# Patient Record
Sex: Female | Born: 1995 | Race: White | Hispanic: No | Marital: Single | Smoking: Never smoker
Health system: Southern US, Community
[De-identification: ages and names within clinical notes are randomized; demographics above are authoritative.]

## PROBLEM LIST (undated history)

## (undated) DIAGNOSIS — F419 Anxiety disorder, unspecified: Secondary | ICD-10-CM

## (undated) DIAGNOSIS — F329 Major depressive disorder, single episode, unspecified: Secondary | ICD-10-CM

## (undated) DIAGNOSIS — T7840XA Allergy, unspecified, initial encounter: Secondary | ICD-10-CM

## (undated) DIAGNOSIS — F32A Depression, unspecified: Secondary | ICD-10-CM

## (undated) HISTORY — DX: Anxiety disorder, unspecified: F41.9

## (undated) HISTORY — PX: WISDOM TOOTH EXTRACTION: SHX21

## (undated) HISTORY — DX: Allergy, unspecified, initial encounter: T78.40XA

## (undated) HISTORY — DX: Major depressive disorder, single episode, unspecified: F32.9

## (undated) HISTORY — DX: Depression, unspecified: F32.A

---

## 2016-12-05 ENCOUNTER — Ambulatory Visit (INDEPENDENT_AMBULATORY_CARE_PROVIDER_SITE_OTHER): Payer: 59

## 2016-12-05 ENCOUNTER — Ambulatory Visit (INDEPENDENT_AMBULATORY_CARE_PROVIDER_SITE_OTHER): Payer: 59 | Admitting: Physician Assistant

## 2016-12-05 ENCOUNTER — Encounter: Payer: Self-pay | Admitting: Physician Assistant

## 2016-12-05 VITALS — BP 117/81 | HR 96 | Temp 98.8°F | Resp 17 | Ht 61.75 in | Wt 178.4 lb

## 2016-12-05 DIAGNOSIS — S76311A Strain of muscle, fascia and tendon of the posterior muscle group at thigh level, right thigh, initial encounter: Secondary | ICD-10-CM | POA: Diagnosis not present

## 2016-12-05 DIAGNOSIS — M25572 Pain in left ankle and joints of left foot: Secondary | ICD-10-CM

## 2016-12-05 MED ORDER — CYCLOBENZAPRINE HCL 10 MG PO TABS: 5.0000 mg | ORAL_TABLET | Freq: Three times a day (TID) | ORAL | 0 refills | Status: DC | PRN

## 2016-12-05 MED ORDER — ACETAMINOPHEN 500 MG PO TABS: 1000.0000 mg | ORAL_TABLET | Freq: Three times a day (TID) | ORAL | 99 refills | Status: AC | PRN

## 2016-12-05 MED ORDER — NAPROXEN 500 MG PO TABS: 500.0000 mg | ORAL_TABLET | Freq: Two times a day (BID) | ORAL | 0 refills | Status: DC

## 2016-12-05 NOTE — Patient Instructions (Addendum)
Rest, Ice, Compression, and Elevation.  Take prescription medication as needed.     IF you received an x-ray today, you will receive an invoice from [facility] Radiology. Please contact [facility] Radiology at [phone] with questions or concerns regarding your invoice.   IF you received labwork today, you will receive an invoice from [place]. Please contact [facility] at [phone] with questions or concerns regarding your invoice.   Our billing staff will not be able to assist you with questions regarding bills from these companies.  You will be contacted with the lab results as soon as they are available. The fastest way to get your results is to activate your My Chart account. Instructions are located on the last page of this paperwork. If you have not heard from [place] regarding the results in 2 weeks, please contact this office.

## 2016-12-05 NOTE — Progress Notes (Signed)
12/05/2016 12:02 PM   DOB: [birth year] / MRN: [id number]  SUBJECTIVE:  Meghan Norman is a 21 y.o. female presenting for a left ankle pain that started after an inversion injury when she was "pulled down some stairs by a dog." She was dog sitting.  Associates lateral ankle swelling.    She complains of posterior right pain distal thigh pain that is worse with movement.  Bending the knee makes the pain worse.  Relaxing the leg make the pain better.   She is allergic to singulair [montelukast sodium].   She  has a past medical history of Allergy; Anxiety; and Depression.    She  reports that she has never smoked. She has never used smokeless tobacco. She reports that she drinks alcohol. She reports that she does not use drugs. She  has no sexual activity history on file. The patient  has no past surgical history on file.  Her family history includes Diabetes in her maternal grandfather; Hyperlipidemia in her father and mother.  Review of Systems  Constitutional: Negative for chills and fever.  Respiratory: Negative for shortness of breath.   Cardiovascular: Negative for chest pain.  Musculoskeletal: Positive for falls, joint pain and myalgias. Negative for back pain and neck pain.  Skin: Negative for itching and rash.  Neurological: Negative for dizziness and headaches.    The problem list and medications were reviewed and updated by myself where necessary and exist elsewhere in the encounter.   OBJECTIVE:  BP 117/81   Pulse 96   Temp 98.8 F (37.1 C) (Oral)   Resp 17   Ht 5' 1.75" (1.568 m)   Wt 178 lb 6 oz (80.9 kg)   LMP 11/28/2016 (Approximate)   SpO2 100%   BMI 32.89 kg/m   Physical Exam  Cardiovascular: Normal rate and regular rhythm.   Pulmonary/Chest: Effort normal and breath sounds normal.  Musculoskeletal:       Left ankle: She exhibits decreased range of motion, swelling and ecchymosis. Tenderness. Lateral malleolus tenderness found. No medial malleolus, no AITFL,  no CF ligament, no posterior TFL, no head of 5th metatarsal and no proximal fibula tenderness found.       Legs:   No results found for this or any previous visit (from the past 72 hour(s)).  Dg Tibia/fibula Left  Result Date: 12/05/2016 CLINICAL DATA:  Fall walking dog, lateral ankle pain EXAM: LEFT TIBIA AND FIBULA - 2 VIEW COMPARISON:  None. FINDINGS: No fracture or dislocation is seen. The joint spaces are preserved. Visualized soft tissues are within normal limits. IMPRESSION: Negative. Electronically Signed   By: Julian Hy M.D.   On: 12/05/2016 11:23   Dg Ankle Complete Left  Result Date: 12/05/2016 CLINICAL DATA:  Fall walking dog, lateral ankle pain EXAM: LEFT ANKLE COMPLETE - 3+ VIEW COMPARISON:  None. FINDINGS: No fracture or dislocation is seen. The ankle mortise is intact. The base of the fifth metatarsal is unremarkable. Mild lateral soft tissue swelling. IMPRESSION: No fracture or dislocation is seen. Mild lateral soft tissue swelling. Electronically Signed   By: Julian Hy M.D.   On: 12/05/2016 11:24    ASSESSMENT AND PLAN:  Meghan Norman was seen today for fall.  Diagnoses and all orders for this visit:  Acute left ankle pain: No fracture. [place]. Pain control. RTC as needed. -     Cancel: DG Ankle Complete Left; Future -     DG Tibia/Fibula Left; Future -     naproxen (NAPROSYN) 500 MG tablet; Take  1 tablet (500 mg total) by mouth 2 (two) times daily with a meal. -     acetaminophen (TYLENOL) 500 MG tablet; Take 2 tablets (1,000 mg total) by mouth every 8 (eight) hours as needed for mild pain, moderate pain or fever. Take 2 tabs every 8 hours. -     DG Ankle Complete Left; Future  Strain of right hamstring -     cyclobenzaprine (FLEXERIL) 10 MG tablet; Take 0.5-1 tablets (5-10 mg total) by mouth 3 (three) times daily as needed.    The patient is advised to call or return to clinic if she does not see an improvement in symptoms, or to seek the care of the  closest emergency department if she worsens with the above plan.   Philis Fendt, MHS, PA-C [facility] 12/05/2016 12:02 PM

## 2017-05-19 ENCOUNTER — Ambulatory Visit (INDEPENDENT_AMBULATORY_CARE_PROVIDER_SITE_OTHER): Payer: 59 | Admitting: Physician Assistant

## 2017-05-19 ENCOUNTER — Encounter: Payer: Self-pay | Admitting: Physician Assistant

## 2017-05-19 VITALS — BP 110/84 | HR 109 | Temp 98.0°F | Resp 18 | Ht 61.0 in | Wt 183.2 lb

## 2017-05-19 DIAGNOSIS — Z130 Encounter for screening for diseases of the blood and blood-forming organs and certain disorders involving the immune mechanism: Secondary | ICD-10-CM | POA: Diagnosis not present

## 2017-05-19 DIAGNOSIS — Z23 Encounter for immunization: Secondary | ICD-10-CM | POA: Diagnosis not present

## 2017-05-19 DIAGNOSIS — Z124 Encounter for screening for malignant neoplasm of cervix: Secondary | ICD-10-CM

## 2017-05-19 DIAGNOSIS — Z13228 Encounter for screening for other metabolic disorders: Secondary | ICD-10-CM

## 2017-05-19 DIAGNOSIS — Z113 Encounter for screening for infections with a predominantly sexual mode of transmission: Secondary | ICD-10-CM

## 2017-05-19 DIAGNOSIS — Z0000 Encounter for general adult medical examination without abnormal findings: Secondary | ICD-10-CM

## 2017-05-19 DIAGNOSIS — Z1329 Encounter for screening for other suspected endocrine disorder: Secondary | ICD-10-CM | POA: Diagnosis not present

## 2017-05-19 DIAGNOSIS — Z308 Encounter for other contraceptive management: Secondary | ICD-10-CM

## 2017-05-19 LAB — POCT URINE PREGNANCY: Preg Test, Ur: NEGATIVE

## 2017-05-19 MED ORDER — MEDROXYPROGESTERONE ACETATE 150 MG/ML IM SUSY
150.0000 mg | PREFILLED_SYRINGE | Freq: Once | INTRAMUSCULAR | Status: AC
Start: 2017-05-19 — End: 2017-05-19
  Administered 2017-05-19: 150 mg via INTRAMUSCULAR

## 2017-05-19 NOTE — Patient Instructions (Addendum)
Keeping You Healthy  Get These Tests 1. Blood Pressure- Have your blood pressure checked once a year by your health care provider.  Normal blood pressure is 120/80. 2. Weight- Have your body mass index (BMI) calculated to screen for obesity.  BMI is measure of body fat based on height and weight.  You can also calculate your own BMI at GravelBags.it. 3. Cholesterol- Have your cholesterol checked every 5 years starting at age 22 then yearly starting at age 74. 23. Chlamydia, HIV, and other sexually transmitted diseases- Get screened every year until age 28, then within three months of each new sexual provider. 5. Pap Test - Every 1-5 years; discuss with your health care provider. 6. Mammogram- Every 1-2 years starting at age 15--50  Take these medicines  Calcium with Vitamin D-Your body needs 1200 mg of Calcium each day and [id number] IU of Vitamin D daily.  Your body can only absorb 500 mg of Calcium at a time so Calcium must be taken in 2 or 3 divided doses throughout the day.  Multivitamin with folic acid- Once daily if it is possible for you to become pregnant.  Get these Immunizations  Gardasil-Series of three doses; prevents HPV related illness such as genital warts and cervical cancer.  Menactra-Single dose; prevents meningitis.  Tetanus shot- Every 10 years.  Flu shot-Every year.  Take these steps 1. Do not smoke-Your healthcare provider can help you quit.  For tips on how to quit go to www.smokefree.gov or call 1-800 QUITNOW. 2. Be physically active- Exercise 5 days a week for at least 30 minutes.  If you are not already physically active, start slow and gradually work up to 30 minutes of moderate physical activity.  Examples of moderate activity include walking briskly, dancing, swimming, bicycling, etc. 3. Breast Cancer- A self breast exam every month is important for early detection of breast cancer.  For more information and instruction on self breast exams, ask your  healthcare provider or https://www.patel.info/. 4. Eat a healthy diet- Eat a variety of healthy foods such as fruits, vegetables, whole grains, low fat milk, low fat cheeses, yogurt, lean meats, poultry and fish, beans, nuts, tofu, etc.  For more information go to www. Thenutritionsource.org 5. Drink alcohol in moderation- Limit alcohol intake to one drink or less per day. Never drink and drive. 6. Depression- Your emotional health is as important as your physical health.  If you're feeling down or losing interest in things you normally enjoy please talk to your healthcare provider about being screened for depression. 7. Dental visit- Brush and floss your teeth twice daily; visit your dentist twice a year. 8. Eye doctor- Get an eye exam at least every 2 years. 9. Helmet use- Always wear a helmet when riding a bicycle, motorcycle, rollerblading or skateboarding. 55. Safe sex- If you may be exposed to sexually transmitted infections, use a condom. 11. Seat belts- Seat belts can save your live; always wear one. 12. Smoke/Carbon Monoxide detectors- These detectors need to be installed on the appropriate level of your home. Replace batteries at least once a year. 13. Skin cancer- When out in the sun please cover up and use sunscreen 15 SPF or higher. 14. Violence- If anyone is threatening or hurting you, please tell your healthcare provider.           IF you received an x-ray today, you will receive an invoice from [facility] Radiology. Please contact [facility] Radiology at [phone] with questions or concerns regarding your invoice.  IF you received labwork today, you will receive an invoice from [place]. Please contact [facility] at [phone] with questions or concerns regarding your invoice.   Our billing staff will not be able to assist you with questions regarding bills from these companies.  You will be contacted with the lab results as soon as they are  available. The fastest way to get your results is to activate your My Chart account. Instructions are located on the last page of this paperwork. If you have not heard from [place] regarding the results in 2 weeks, please contact this office.

## 2017-05-19 NOTE — Progress Notes (Signed)
PRIMARY CARE AT [facility] [address] [place] [address] [phone]  Date:  05/19/2017   Name:  Meghan Norman   DOB:  02-21-1996   MRN:  [id number]  PCP:  Patient, No Pcp Per    History of Present Illness:  Meghan Norman is a 21 y.o. female patient who presents to PCP with  Chief Complaint  Patient presents with  . Annual Exam    With PAP, Pt would like to discuss BC     DIET: she eats yogurt, or hot bar from [employer].  She will get vegetables in almost daily.  Eats a lot of broccoli and green beans.  Water intake 16 oz.  Caffeine: sodas occasionally, cups 1-2 coffee.  BM: normal.  No blood or black stool;.  No constipation or diarrhea  URINATION: normal.  No dysuria, hematuria, or frequency.    SLEEP: 4-8 hours per night.  She feels anxiety which may keep her up at night.   SOCIAL ACTIVITY: she reads, watch TV, hang out with friends. She is currently not exercising.  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     No tobacco or vaping No illicit drug use 1-2 etoh beverages per week.    Menses: irregular, but has regulated.  She was on depo shot where it was regular.  After discontinuing the depo shot, she had a lot of irregularity Sexually active.  With condoms.  No dyspareunia. No history of std.  No history of abnormal pap smear.   There are no active problems to display for this patient.   Past Medical History:  Diagnosis Date  . Allergy   . Anxiety   . Depression     Past Surgical History:  Procedure Laterality Date  . WISDOM TOOTH EXTRACTION Bilateral     Social History   Tobacco Use  . Smoking status: Never Smoker  . Smokeless tobacco: Never Used  Substance Use Topics  . Alcohol use: Yes  . Drug use: No    Family History  Problem Relation Age of Onset  . Hyperlipidemia Mother   . Hyperlipidemia Father   . Diabetes Maternal Grandfather     Allergies  Allergen Reactions  . Hydrocodone Itching  . Singulair [Montelukast Sodium]  Other (See Comments)    Bad mood swings    Medication list has been reviewed and updated.  Current Outpatient Medications on File Prior to Visit  Medication Sig Dispense Refill  . acetaminophen (TYLENOL) 500 MG tablet Take 2 tablets (1,000 mg total) by mouth every 8 (eight) hours as needed for mild pain, moderate pain or fever. Take 2 tabs every 8 hours. 90 tablet prn  . Beclomethasone Diprop, Nasal, (QNASL NA) Place into the nose.    . butalbital-aspirin-caffeine (FIORINAL) 50-325-40 MG capsule Take 1 capsule 2 (two) times daily as needed by mouth for headache.    . cyclobenzaprine (FLEXERIL) 10 MG tablet Take 0.5-1 tablets (5-10 mg total) by mouth 3 (three) times daily as needed. (Patient not taking: Reported on 05/19/2017) 30 tablet 0  . naproxen (NAPROSYN) 500 MG tablet Take 1 tablet (500 mg total) by mouth 2 (two) times daily with a meal. (Patient not taking: Reported on 05/19/2017) 30 tablet 0   No current facility-administered medications on file prior to visit.     Review of Systems  Constitutional: Negative for chills and fever.  HENT: Negative for ear discharge, ear pain and sore throat.   Eyes: Negative for blurred vision and double vision.  Respiratory: Negative for cough, shortness of breath and wheezing.   Cardiovascular: Negative for chest pain, palpitations and leg swelling.  Gastrointestinal: Negative for diarrhea, nausea and vomiting.  Genitourinary: Negative for dysuria, frequency and hematuria.  Skin: Negative for itching and rash.  Neurological: Negative for dizziness and headaches.   ROS otherwise unremarkable unless listed above.  Physical Examination: BP 110/84 (BP Location: Left Arm, Patient Position: Sitting, Cuff Size: Normal)   Pulse (!) 109   Temp 98 F (36.7 C) (Oral)   Resp 18   Ht _0  (1.549 m)   Wt 183 lb 3.2 oz (83.1 kg)   LMP 05/11/2017   SpO2 98%   BMI 34.62 kg/m  Ideal Body Weight: Weight in (lb) to have BMI = 25: 132  Physical Exam   Constitutional: She is oriented to person, place, and time. She appears well-developed and well-nourished. No distress.  HENT:  Head: Normocephalic and atraumatic.  Right Ear: Tympanic membrane, external ear and ear canal normal.  Left Ear: Tympanic membrane, external ear and ear canal normal.  Nose: Right sinus exhibits no maxillary sinus tenderness and no frontal sinus tenderness. Left sinus exhibits no maxillary sinus tenderness and no frontal sinus tenderness.  Mouth/Throat: Oropharynx is clear and moist. No uvula swelling. No oropharyngeal exudate, posterior oropharyngeal edema or posterior oropharyngeal erythema.  Eyes: Conjunctivae and EOM are normal. Pupils are equal, round, and reactive to light.  Neck: Normal range of motion. Neck supple. No thyromegaly present.  Cardiovascular: Normal rate, regular rhythm, normal heart sounds and intact distal pulses. Exam reveals no gallop, no distant heart sounds and no friction rub.  No murmur heard. Pulmonary/Chest: Effort normal and breath sounds normal. No respiratory distress. She has no decreased breath sounds. She has no wheezes. She has no rhonchi.  Abdominal: Soft. Bowel sounds are normal. She exhibits no distension and no mass. There is no tenderness.  Musculoskeletal: Normal range of motion. She exhibits no edema or tenderness.  Lymphadenopathy:       Head (right side): No submandibular, no tonsillar, no preauricular and no posterior auricular adenopathy present.       Head (left side): No submandibular, no tonsillar, no preauricular and no posterior auricular adenopathy present.    She has no cervical adenopathy.  Neurological: She is alert and oriented to person, place, and time. No cranial nerve deficit. She exhibits normal muscle tone. Coordination normal.  Skin: Skin is warm and dry. She is not diaphoretic.  Psychiatric: She has a normal mood and affect. Her behavior is normal.    Results for orders placed or performed in visit  on 05/19/17  POCT urine pregnancy  Result Value Ref Range   Preg Test, Ur Negative Negative    Assessment and Plan: Meghan Norman is a 21 y.o. female who is here today for  Chief Complaint  Patient presents with  . Annual Exam    With PAP, Pt would like to discuss BC  rtc in 3 months for depo Annual physical exam - Plan: POCT urine pregnancy, CBC, CMP14+EGFR, TSH, RPR, HIV antibody, Pap IG and Chlamydia/Gonococcus, NAA, CANCELED: Lipid panel, CANCELED: Pap IG, CT/NG w/ reflex HPV when ASC-U  Flu vaccine need - Plan: Flu Vaccine QUAD 36+ mos IM  Encounter for other contraceptive management - Plan: POCT urine pregnancy  Screening for deficiency anemia  Screening for metabolic disorder  Screening for thyroid disorder  Screening for STD (sexually transmitted disease) - Plan: Pap IG and Chlamydia/Gonococcus, NAA  Screening for cervical  cancer - Plan: Pap IG and Chlamydia/Gonococcus, NAA  Ivar Drape, PA-C [facility] 11/7/20183:12 PM

## 2017-05-20 ENCOUNTER — Telehealth: Payer: Self-pay | Admitting: *Deleted

## 2017-05-20 LAB — CMP14+EGFR
ALT: 14 IU/L (ref 0–32)
AST: 17 IU/L (ref 0–40)
Albumin/Globulin Ratio: 1.7 (ref 1.2–2.2)
Albumin: 4.3 g/dL (ref 3.5–5.5)
Alkaline Phosphatase: 60 IU/L (ref 39–117)
BUN/Creatinine Ratio: 18 (ref 9–23)
BUN: 12 mg/dL (ref 6–20)
CALCIUM: 9.5 mg/dL (ref 8.7–10.2)
CHLORIDE: 105 mmol/L (ref 96–106)
CO2: 27 mmol/L (ref 20–29)
Creatinine, Ser: 0.67 mg/dL (ref 0.57–1.00)
GFR, EST AFRICAN AMERICAN: 144 mL/min/{1.73_m2} (ref >59)
GFR, EST NON AFRICAN AMERICAN: 125 mL/min/{1.73_m2} (ref >59)
GLUCOSE: 85 mg/dL (ref 65–99)
Globulin, Total: 2.5 g/dL (ref 1.5–4.5)
POTASSIUM: 3.9 mmol/L (ref 3.5–5.2)
Sodium: 142 mmol/L (ref 134–144)
TOTAL PROTEIN: 6.8 g/dL (ref 6.0–8.5)

## 2017-05-20 LAB — RPR: RPR: NONREACTIVE

## 2017-05-20 LAB — CBC
HEMATOCRIT: 41.1 % (ref 34.0–46.6)
HEMOGLOBIN: 13.9 g/dL (ref 11.1–15.9)
MCH: 28 pg (ref 26.6–33.0)
MCHC: 33.8 g/dL (ref 31.5–35.7)
MCV: 83 fL (ref 79–97)
Platelets: 281 10*3/uL (ref 150–379)
RBC: 4.96 x10E6/uL (ref 3.77–5.28)
RDW: 13.8 % (ref 12.3–15.4)
WBC: 4.9 10*3/uL (ref 3.4–10.8)

## 2017-05-20 LAB — TSH: TSH: 2.15 u[IU]/mL (ref 0.450–4.500)

## 2017-05-20 LAB — HIV ANTIBODY (ROUTINE TESTING W REFLEX): HIV SCREEN 4TH GENERATION: NONREACTIVE

## 2017-05-20 NOTE — Telephone Encounter (Signed)
Pt. returned call for lab results; results given; gave recommendations by Ivar Drape, PA.  Pt. verbalized understanding.    Pt. asking about when she goes back in  12 weeks for Depo Provera Injection; questioned if she needs to schedule an appt. or if it is done on a walk-in basis? Advised will notify the office of her question.

## 2017-05-20 NOTE — Telephone Encounter (Signed)
LMOM for pt to call the office regarding lab results.

## 2017-05-22 LAB — PAP IG AND CT-NG NAA
CHLAMYDIA, NUC. ACID AMP: NEGATIVE
GONOCOCCUS BY NUCLEIC ACID AMP: NEGATIVE
PAP SMEAR COMMENT: 0

## 2017-05-24 NOTE — Telephone Encounter (Signed)
Patient informed. 

## 2017-08-18 ENCOUNTER — Telehealth: Payer: Self-pay | Admitting: Physician Assistant

## 2017-08-18 NOTE — Telephone Encounter (Signed)
Called to remind pt of appt tomorrow. Left message. ° °Reminded pt of 15 minutes early, building number and 10 minute late policy. °

## 2017-08-19 ENCOUNTER — Ambulatory Visit: Payer: 59 | Admitting: Physician Assistant

## 2017-08-19 ENCOUNTER — Encounter: Payer: Self-pay | Admitting: Physician Assistant

## 2017-08-19 VITALS — BP 110/78 | HR 85 | Temp 98.6°F | Resp 16 | Ht 61.0 in | Wt 182.0 lb

## 2017-08-19 DIAGNOSIS — R0981 Nasal congestion: Secondary | ICD-10-CM | POA: Diagnosis not present

## 2017-08-19 DIAGNOSIS — R51 Headache: Secondary | ICD-10-CM | POA: Diagnosis not present

## 2017-08-19 DIAGNOSIS — R519 Headache, unspecified: Secondary | ICD-10-CM

## 2017-08-19 MED ORDER — DOXYCYCLINE HYCLATE 100 MG PO CAPS: 100.0000 mg | ORAL_CAPSULE | Freq: Two times a day (BID) | ORAL | 0 refills | Status: AC

## 2017-08-19 MED ORDER — BECLOMETHASONE DIPROPIONATE 80 MCG/ACT NA AERS: 1.0000 "application " | INHALATION_SPRAY | Freq: Every day | NASAL | 0 refills | Status: DC

## 2017-08-19 MED ORDER — KETOROLAC TROMETHAMINE 60 MG/2ML IM SOLN
60.0000 mg | Freq: Once | INTRAMUSCULAR | Status: AC
  Administered 2017-08-19: 60 mg via INTRAMUSCULAR

## 2017-08-19 MED ORDER — BUTALBITAL-APAP-CAFFEINE 50-325-40 MG PO TABS: 1.0000 | ORAL_TABLET | Freq: Three times a day (TID) | ORAL | 0 refills | Status: AC | PRN

## 2017-08-19 NOTE — Patient Instructions (Addendum)
Please continue to hydrate well with 64oz.   I will contact you in regards to your lab results.   Please take medication as prescribed.  General Headache Without Cause A headache is pain or discomfort felt around the head or neck area. There are many causes and types of headaches. In some cases, the cause may not be found. Follow these instructions at home: Managing pain  Take over-the-counter and prescription medicines only as told by your doctor.  Lie down in a dark, quiet room when you have a headache.  If directed, apply ice to the head and neck area: ? Put ice in a plastic bag. ? Place a towel between your skin and the bag. ? Leave the ice on for 20 minutes, 2-3 times per day.  Use a heating pad or hot shower to apply heat to the head and neck area as told by your doctor.  Keep lights dim if bright lights bother you or make your headaches worse. Eating and drinking  Eat meals on a regular schedule.  Lessen how much alcohol you drink.  Lessen how much caffeine you drink, or stop drinking caffeine. General instructions  Keep all follow-up visits as told by your doctor. This is important.  Keep a journal to find out if certain things bring on headaches. For example, write down: ? What you eat and drink. ? How much sleep you get. ? Any change to your diet or medicines.  Relax by getting a massage or doing other relaxing activities.  Lessen stress.  Sit up straight. Do not tighten (tense) your muscles.  Do not use tobacco products. This includes cigarettes, chewing tobacco, or e-cigarettes. If you need help quitting, ask your doctor.  Exercise regularly as told by your doctor.  Get enough sleep. This often means 7-9 hours of sleep. Contact a doctor if:  Your symptoms are not helped by medicine.  You have a headache that feels different than the other headaches.  You feel sick to your stomach (nauseous) or you throw up (vomit).  You have a fever. Get help right  away if:  Your headache becomes really bad.  You keep throwing up.  You have a stiff neck.  You have trouble seeing.  You have trouble speaking.  You have pain in the eye or ear.  Your muscles are weak or you lose muscle control.  You lose your balance or have trouble walking.  You feel like you will pass out (faint) or you pass out.  You have confusion. This information is not intended to replace advice given to you by your health care provider. Make sure you discuss any questions you have with your health care provider. Document Released: 04/07/2008 Document Revised: 12/05/2015 Document Reviewed: 10/22/2014 Elsevier Interactive Patient Education  2018 [employer].    IF you received an x-ray today, you will receive an invoice from [facility] Radiology. Please contact [facility] Radiology at [phone] with questions or concerns regarding your invoice.   IF you received labwork today, you will receive an invoice from [place]. Please contact [facility] at [phone] with questions or concerns regarding your invoice.   Our billing staff will not be able to assist you with questions regarding bills from these companies.  You will be contacted with the lab results as soon as they are available. The fastest way to get your results is to activate your My Chart account. Instructions are located on the last page of this paperwork. If you have not heard from [place]  regarding the results in 2 weeks, please contact this office.

## 2017-08-19 NOTE — Progress Notes (Signed)
PRIMARY CARE AT [facility] [address], [place] [address] [phone]  Date:  08/19/2017   Name:  Meghan Norman   DOB:  12/07/1995   MRN:  [id number]  PCP:  Patient, No Pcp Per    History of Present Illness:  Meghan Norman is a 22 y.o. female patient who presents to PCP with  Chief Complaint  Patient presents with  . Migraine    x 5 days  . Depo Injection     No throat pain, but does have some ear pain.  She has mild nasal congestion.  She has facial pain at the nose.  Started 5 days ago, 10/10 initially, but dulled to 4/10.  Pressure pain.  She attempted the fiorinal, but this made her sick the last time she tried it.  Photo and phonophobia.  Initial nausea first day, but thsi has resolved.  She had no triggers, no trauma.   She has had this in the past where she has a light headache, but had headache.   1-2 cups of coffee per day, which she has not changed.   She has been eating better as her diet change, including more vegetable--wihtout portion control.  No fever or neck stiffness.    There are no active problems to display for this patient.   Past Medical History:  Diagnosis Date  . Allergy   . Anxiety   . Depression     Past Surgical History:  Procedure Laterality Date  . WISDOM TOOTH EXTRACTION Bilateral     Social History   Tobacco Use  . Smoking status: Never Smoker  . Smokeless tobacco: Never Used  Substance Use Topics  . Alcohol use: Yes  . Drug use: No    Family History  Problem Relation Age of Onset  . Hyperlipidemia Mother   . Hyperlipidemia Father   . Diabetes Maternal Grandfather   . Melanoma Other     Allergies  Allergen Reactions  . Hydrocodone Itching  . Singulair [Montelukast Sodium] Other (See Comments)    Bad mood swings    Medication list has been reviewed and updated.  Current Outpatient Medications on File Prior to Visit  Medication Sig Dispense Refill  . acetaminophen (TYLENOL) 500 MG tablet Take 2 tablets (1,000 mg  total) by mouth every 8 (eight) hours as needed for mild pain, moderate pain or fever. Take 2 tabs every 8 hours. 90 tablet prn  . Beclomethasone Diprop, Nasal, (QNASL NA) Place into the nose.    . butalbital-aspirin-caffeine (FIORINAL) 50-325-40 MG capsule Take 1 capsule 2 (two) times daily as needed by mouth for headache.    . cyclobenzaprine (FLEXERIL) 10 MG tablet Take 0.5-1 tablets (5-10 mg total) by mouth 3 (three) times daily as needed. (Patient not taking: Reported on 05/19/2017) 30 tablet 0  . naproxen (NAPROSYN) 500 MG tablet Take 1 tablet (500 mg total) by mouth 2 (two) times daily with a meal. (Patient not taking: Reported on 05/19/2017) 30 tablet 0   No current facility-administered medications on file prior to visit.     ROS ROS otherwise unremarkable unless listed above.  Physical Examination: BP 110/78   Pulse 85   Temp 98.6 F (37 C) (Oral)   Resp 16   Ht 5\' 1"  (1.549 m)   Wt 182 lb (82.6 kg)   LMP 08/10/2017   SpO2 98%   BMI 34.39 kg/m  Ideal Body Weight: Weight in (lb) to have BMI = 25: 132  Physical Exam  Constitutional: She is  oriented to person, place, and time. She appears well-developed and well-nourished. No distress.  HENT:  Head: Normocephalic and atraumatic.  Right Ear: Tympanic membrane, external ear and ear canal normal.  Left Ear: Tympanic membrane, external ear and ear canal normal.  Nose: Mucosal edema present. No rhinorrhea. Right sinus exhibits frontal sinus tenderness. Right sinus exhibits no maxillary sinus tenderness. Left sinus exhibits frontal sinus tenderness. Left sinus exhibits no maxillary sinus tenderness.  Mouth/Throat: No uvula swelling. No oropharyngeal exudate, posterior oropharyngeal edema or posterior oropharyngeal erythema.  Eyes: Conjunctivae and EOM are normal. Pupils are equal, round, and reactive to light.  Cardiovascular: Normal rate and regular rhythm. Exam reveals no gallop, no distant heart sounds and no friction rub.  No  murmur heard. Pulmonary/Chest: Effort normal. No respiratory distress. She has no decreased breath sounds. She has no wheezes. She has no rhonchi.  Lymphadenopathy:       Head (right side): No submandibular, no tonsillar, no preauricular and no posterior auricular adenopathy present.       Head (left side): No submandibular, no tonsillar, no preauricular and no posterior auricular adenopathy present.  Neurological: She is alert and oriented to person, place, and time.  Skin: She is not diaphoretic.  Psychiatric: She has a normal mood and affect. Her behavior is normal.     Assessment and Plan: Meghan Norman is a 22 y.o. female who is here today for cc of  Chief Complaint  Patient presents with  . Migraine    x 5 days  . Depo Injection  possible sinus related.  Given toradol injection at this time.  Will treat for bacterial etiology.  Refilling headache abortive medication.  Advised hydration. Alarming sxs given to warrant an immediate return.   Follow up as needed.  Advised to return to the clinic in 1 year I would like to see the headache improve prior to having her depo provera injection.  She is agreeable to this.  Acute nonintractable headache, unspecified headache type - Plan: butalbital-acetaminophen-caffeine (FIORICET, ESGIC) 50-325-40 MG tablet, Basic metabolic panel, CBC, doxycycline (VIBRAMYCIN) 100 MG capsule, ketorolac (TORADOL) injection 60 mg  Nasal congestion - Plan: Beclomethasone Dipropionate 80 MCG/ACT AERS  Meghan Drape, PA-C [facility] 2/22/20199:33 AM

## 2017-08-20 LAB — CBC
Hematocrit: 42.7 % (ref 34.0–46.6)
Hemoglobin: 14.5 g/dL (ref 11.1–15.9)
MCH: 27.8 pg (ref 26.6–33.0)
MCHC: 34 g/dL (ref 31.5–35.7)
MCV: 82 fL (ref 79–97)
Platelets: 235 10*3/uL (ref 150–379)
RBC: 5.21 x10E6/uL (ref 3.77–5.28)
RDW: 14 % (ref 12.3–15.4)
WBC: 4.4 10*3/uL (ref 3.4–10.8)

## 2017-08-20 LAB — BASIC METABOLIC PANEL
BUN / CREAT RATIO: 18 (ref 9–23)
BUN: 11 mg/dL (ref 6–20)
CO2: 22 mmol/L (ref 20–29)
Calcium: 9.4 mg/dL (ref 8.7–10.2)
Chloride: 105 mmol/L (ref 96–106)
Creatinine, Ser: 0.6 mg/dL (ref 0.57–1.00)
GFR, EST AFRICAN AMERICAN: 150 mL/min/{1.73_m2} (ref >59)
GFR, EST NON AFRICAN AMERICAN: 130 mL/min/{1.73_m2} (ref >59)
Glucose: 92 mg/dL (ref 65–99)
POTASSIUM: 4.3 mmol/L (ref 3.5–5.2)
Sodium: 140 mmol/L (ref 134–144)

## 2017-09-03 ENCOUNTER — Encounter: Payer: Self-pay | Admitting: Physician Assistant

## 2017-09-09 ENCOUNTER — Ambulatory Visit: Payer: 59 | Admitting: Physician Assistant

## 2017-09-09 VITALS — BP 118/75 | HR 87 | Temp 99.1°F | Resp 16 | Ht 61.0 in | Wt 185.0 lb

## 2017-09-09 DIAGNOSIS — Z30019 Encounter for initial prescription of contraceptives, unspecified: Secondary | ICD-10-CM | POA: Diagnosis not present

## 2017-09-09 DIAGNOSIS — J00 Acute nasopharyngitis [common cold]: Secondary | ICD-10-CM | POA: Diagnosis not present

## 2017-09-09 DIAGNOSIS — J029 Acute pharyngitis, unspecified: Secondary | ICD-10-CM

## 2017-09-09 LAB — POCT URINE PREGNANCY: PREG TEST UR: NEGATIVE

## 2017-09-09 LAB — POCT RAPID STREP A (OFFICE): RAPID STREP A SCREEN: NEGATIVE

## 2017-09-09 MED ORDER — MEDROXYPROGESTERONE ACETATE 150 MG/ML IM SUSP
150.0000 mg | Freq: Once | INTRAMUSCULAR | Status: AC
  Administered 2017-09-09: 150 mg via INTRAMUSCULAR

## 2017-09-09 MED ORDER — GUAIFENESIN ER 1200 MG PO TB12: 1.0000 | ORAL_TABLET | Freq: Two times a day (BID) | ORAL | 1 refills | Status: DC | PRN

## 2017-09-09 NOTE — Patient Instructions (Addendum)
Make sure you are hydrating well with 64 oz of water if not more.   I will follow up with the culture results.  Please try an allegra, claritin, or zyrtec.  Take once per day.  Postnasal Drip Postnasal drip is the feeling of mucus going down the back of your throat. Mucus is a slimy substance that moistens and cleans your nose and throat, as well as the air pockets in face bones near your forehead and cheeks (sinuses). Small amounts of mucus pass from your nose and sinuses down the back of your throat all the time. This is normal. When you produce too much mucus or the mucus gets too thick, you can feel it. Some common causes of postnasal drip include:  Having more mucus because of: ? A cold or the flu. ? Allergies. ? Cold air. ? Certain medicines.  Having more mucus that is thicker because of: ? A sinus or nasal infection. ? Dry air. ? A food allergy.  Follow these instructions at home: Relieving discomfort  Gargle with a salt-water mixture 3-4 times a day or as needed. To make a salt-water mixture, completely dissolve -1 tsp of salt in 1 cup of warm water.  If the air in your home is dry, use a humidifier to add moisture to the air.  Use a saline spray or container (neti pot) to flush out the nose (nasal irrigation). These methods can help clear away mucus and keep the nasal passages moist. General instructions  Take over-the-counter and prescription medicines only as told by your health care provider.  Follow instructions from your health care provider about eating or drinking restrictions. You may need to avoid caffeine.  Avoid things that you know you are allergic to (allergens), like dust, mold, pollen, pets, or certain foods.  Drink enough fluid to keep your urine pale yellow.  Keep all follow-up visits as told by your health care provider. This is important. Contact a health care provider if:  You have a fever.  You have a sore throat.  You have difficulty  swallowing.  You have headache.  You have sinus pain.  You have a cough that does not go away.  The mucus from your nose becomes thick and is green or yellow in color.  You have cold or flu symptoms that last more than 10 days. Summary  Postnasal drip is the feeling of mucus going down the back of your throat.  If your health care provider approves, use nasal irrigation or a nasal spray 2?4 times a day.  Avoid things that you know you are allergic to (allergens), like dust, mold, pollen, pets, or certain foods. This information is not intended to replace advice given to you by your health care provider. Make sure you discuss any questions you have with your health care provider. Document Released: 10/12/2016 Document Revised: 10/12/2016 Document Reviewed: 10/12/2016 Elsevier Interactive Patient Education  2018 [employer].     IF you received an x-ray today, you will receive an invoice from [facility]. Please contact [facility] at [phone] with questions or concerns regarding your invoice.   IF you received labwork today, you will receive an invoice from [place]. Please contact [facility] at [phone] with questions or concerns regarding your invoice.   Our billing staff will not be able to assist you with questions regarding bills from these companies.  You will be contacted with the lab results as soon as they are available. The fastest way to get your  results is to activate your My Chart account. Instructions are located on the last page of this paperwork. If you have not heard from [place] regarding the results in 2 weeks, please contact this office.

## 2017-09-09 NOTE — Progress Notes (Signed)
[place] [address] [place] [address] [phone]  Date:  09/09/2017   Name:  Meghan Norman   DOB:  1996-02-21   MRN:  [id number]  PCP:  Patient, No Pcp Per    History of Present Illness:  Meghan Norman is a 22 y.o. female patient who presents to PCP with  Chief Complaint  Patient presents with  . Sore Throat    x 2 days  . Contraception    depo shot     Sore throat for 2 days.  No fever.  No nasal congestion or cough.  No sob or dyspnea.  Some neck pain.    Contraception depo provera.  Outdated secondary to her here for migraine.  There are no active problems to display for this patient.   Past Medical History:  Diagnosis Date  . Allergy   . Anxiety   . Depression     Past Surgical History:  Procedure Laterality Date  . WISDOM TOOTH EXTRACTION Bilateral     Social History   Tobacco Use  . Smoking status: Never Smoker  . Smokeless tobacco: Never Used  Substance Use Topics  . Alcohol use: Yes  . Drug use: No    Family History  Problem Relation Age of Onset  . Hyperlipidemia Mother   . Hyperlipidemia Father   . Diabetes Maternal Grandfather   . Melanoma Other     Allergies  Allergen Reactions  . Hydrocodone Itching  . Singulair [Montelukast Sodium] Other (See Comments)    Bad mood swings    Medication list has been reviewed and updated.  Current Outpatient Medications on File Prior to Visit  Medication Sig Dispense Refill  . acetaminophen (TYLENOL) 500 MG tablet Take 2 tablets (1,000 mg total) by mouth every 8 (eight) hours as needed for mild pain, moderate pain or fever. Take 2 tabs every 8 hours. 90 tablet prn  . Beclomethasone Dipropionate 80 MCG/ACT AERS Place 1 application into the nose daily. 8.7 g 0  . butalbital-acetaminophen-caffeine (FIORICET, ESGIC) 50-325-40 MG tablet Take 1-2 tablets by mouth every 8 (eight) hours as needed for headache. 20 tablet 0  . butalbital-aspirin-caffeine (FIORINAL) 50-325-40 MG capsule  Take 1 capsule 2 (two) times daily as needed by mouth for headache.    . cyclobenzaprine (FLEXERIL) 10 MG tablet Take 0.5-1 tablets (5-10 mg total) by mouth 3 (three) times daily as needed. (Patient not taking: Reported on 05/19/2017) 30 tablet 0  . naproxen (NAPROSYN) 500 MG tablet Take 1 tablet (500 mg total) by mouth 2 (two) times daily with a meal. (Patient not taking: Reported on 05/19/2017) 30 tablet 0   No current facility-administered medications on file prior to visit.     ROS ROS otherwise unremarkable unless listed above.  Physical Examination: BP 118/75   Pulse 87   Temp 99.1 F (37.3 C) (Oral)   Resp 16   Ht 5\' 1"  (1.549 m)   Wt 185 lb (83.9 kg)   LMP 09/07/2017   SpO2 99%   BMI 34.96 kg/m  Ideal Body Weight: Weight in (lb) to have BMI = 25: 132  Physical Exam  Constitutional: She is oriented to person, place, and time. She appears well-developed and well-nourished. No distress.  HENT:  Head: Normocephalic and atraumatic.  Right Ear: Tympanic membrane, external ear and ear canal normal.  Left Ear: Tympanic membrane, external ear and ear canal normal.  Nose: Mucosal edema and rhinorrhea present. Right sinus exhibits no maxillary sinus tenderness and no  frontal sinus tenderness. Left sinus exhibits no maxillary sinus tenderness and no frontal sinus tenderness.  Mouth/Throat: No uvula swelling. No oropharyngeal exudate, posterior oropharyngeal edema or posterior oropharyngeal erythema.  Eyes: Conjunctivae and EOM are normal. Pupils are equal, round, and reactive to light.  Cardiovascular: Normal rate and regular rhythm. Exam reveals no gallop, no distant heart sounds and no friction rub.  No murmur heard. Pulmonary/Chest: Effort normal. No respiratory distress. She has no decreased breath sounds. She has no wheezes. She has no rhonchi.  Lymphadenopathy:       Head (right side): No submandibular, no tonsillar, no preauricular and no posterior auricular adenopathy present.        Head (left side): No submandibular, no tonsillar, no preauricular and no posterior auricular adenopathy present.  Neurological: She is alert and oriented to person, place, and time.  Skin: She is not diaphoretic.  Psychiatric: She has a normal mood and affect. Her behavior is normal.    Results for orders placed or performed in visit on 09/09/17  POCT urine pregnancy  Result Value Ref Range   Preg Test, Ur Negative Negative  POCT rapid strep A  Result Value Ref Range   Rapid Strep A Screen Negative Negative     Assessment and Plan: Meghan Norman is a 22 y.o. female who is here today for cc of  Chief Complaint  Patient presents with  . Sore Throat    x 2 days  . Contraception    depo shot  return q 3 months for injection.   Treating acute rhinitis Acute rhinitis  Sore throat - Plan: POCT rapid strep A, Culture, Group A Strep  Encounter for female birth control - Plan: POCT urine pregnancy, medroxyPROGESTERone (DEPO-PROVERA) injection 150 mg  Ivar Drape, PA-C [facility] 3/12/20191:45 PM

## 2017-09-11 LAB — CULTURE, GROUP A STREP: STREP A CULTURE: NEGATIVE

## 2017-09-15 ENCOUNTER — Encounter: Payer: Self-pay | Admitting: Radiology

## 2017-09-21 ENCOUNTER — Encounter: Payer: Self-pay | Admitting: Physician Assistant

## 2017-10-12 ENCOUNTER — Encounter: Payer: Self-pay | Admitting: Physician Assistant

## 2018-07-02 IMAGING — DX DG TIBIA/FIBULA 2V*L*
2 series · 2 of 2 positions shown · non-contrast
Comparison: None.

CLINICAL DATA: Fall walking dog, lateral ankle pain

EXAM:
LEFT TIBIA AND FIBULA - 2 VIEW

[tibia ap (1 of 2)]
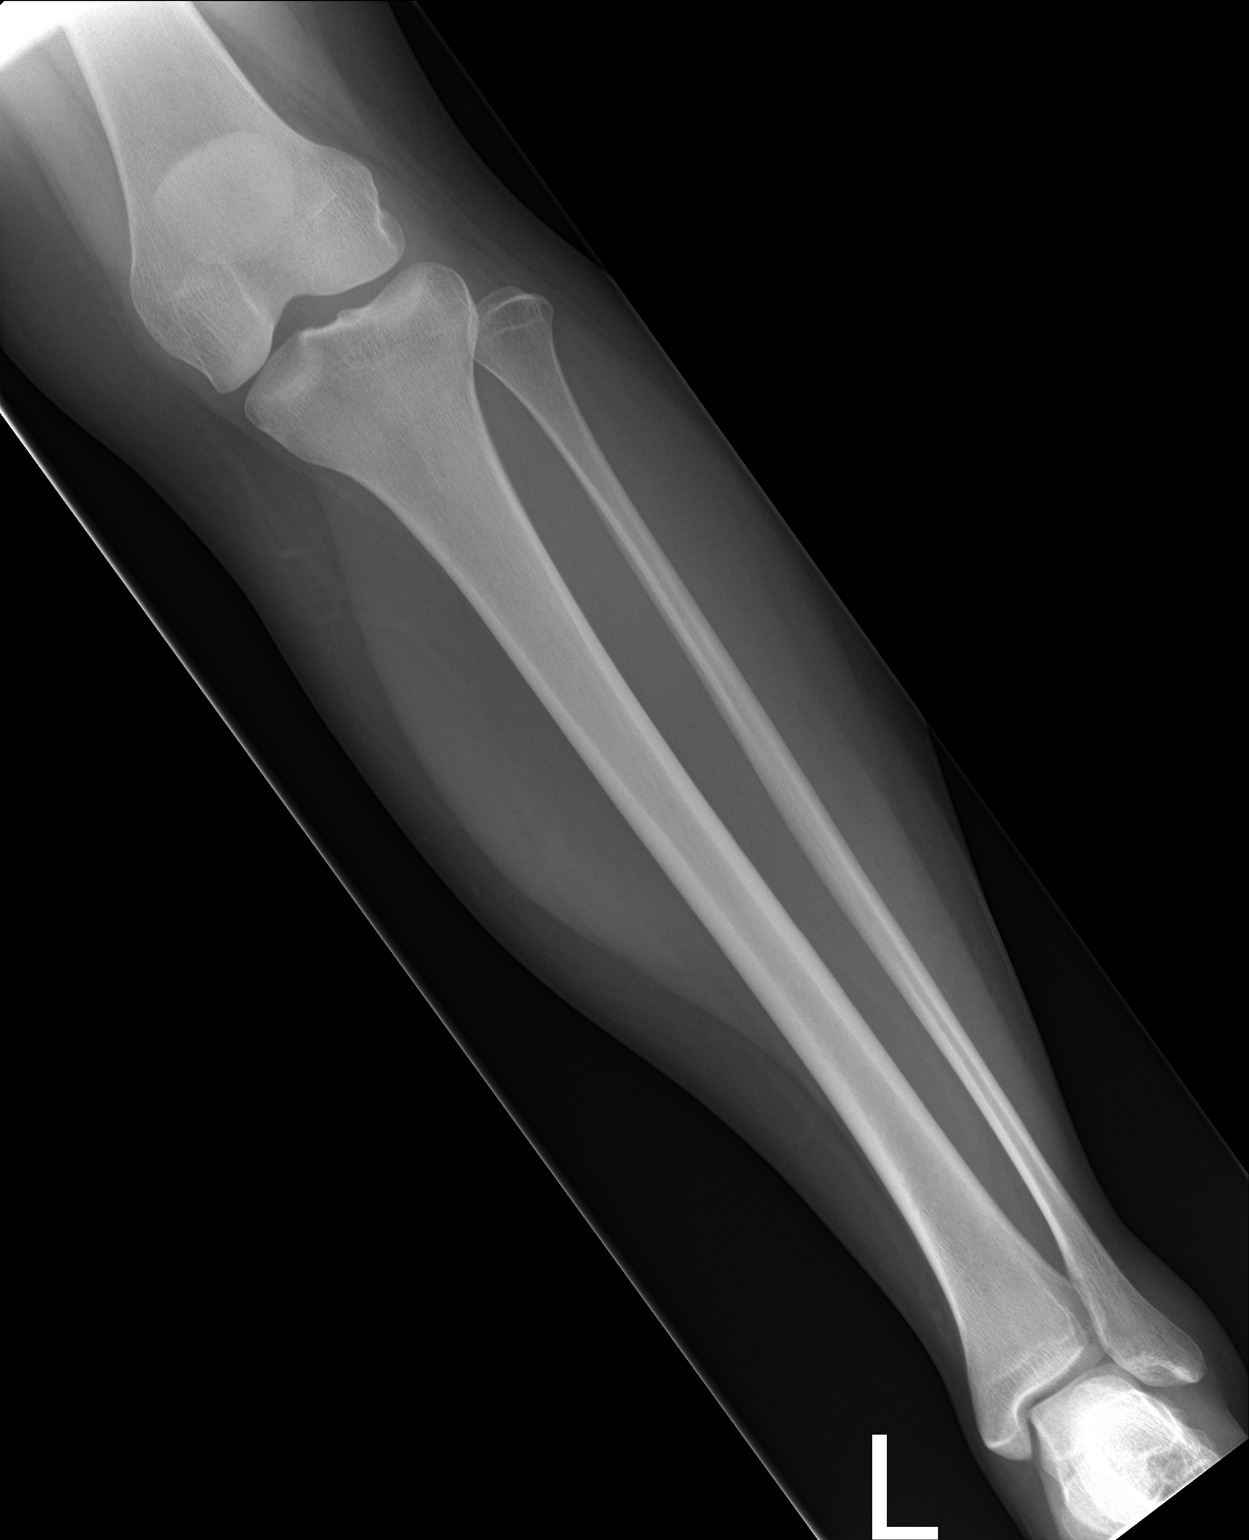

[tibia ap (2 of 2)]
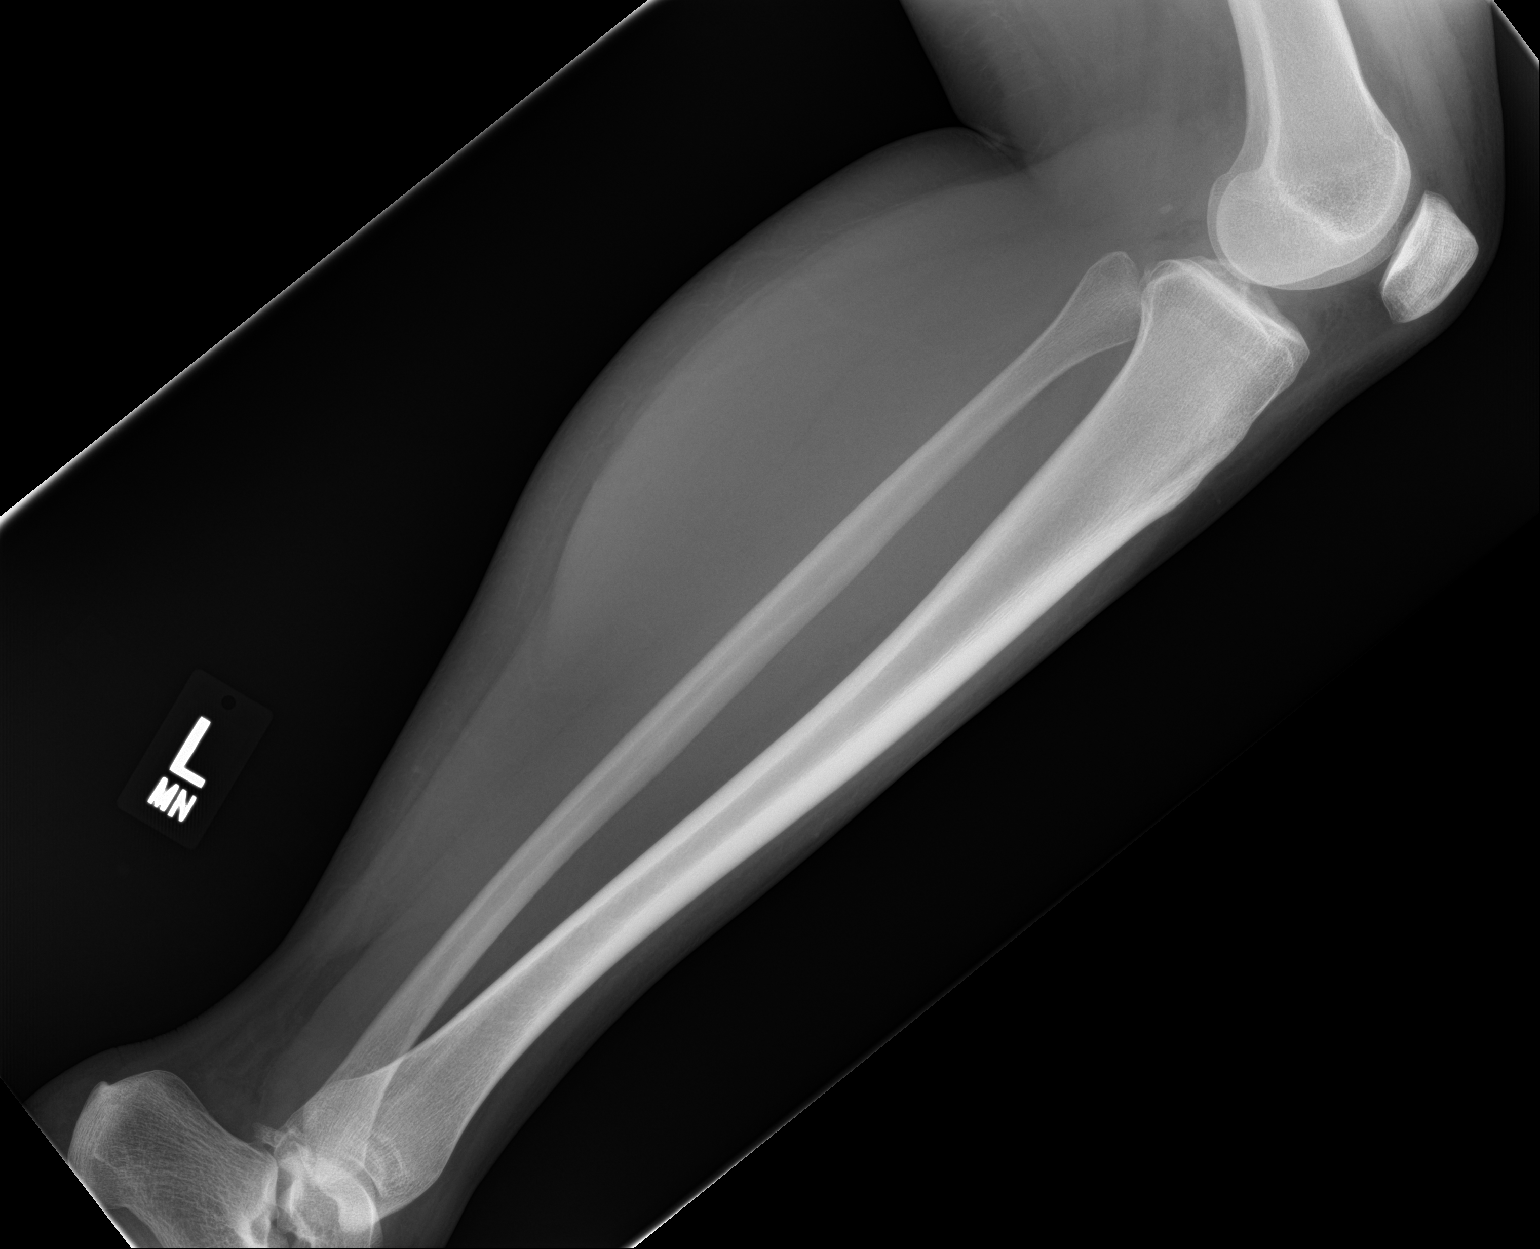

[2 of 2 positions shown; findings below may reference images not displayed]

FINDINGS: No fracture or dislocation is seen.

The joint spaces are preserved.

Visualized soft tissues are within normal limits.
IMPRESSION: Negative.

## 2018-07-02 IMAGING — DX DG ANKLE COMPLETE 3+V*L*
4 series · 4 of 4 positions shown · non-contrast
Comparison: None.

CLINICAL DATA: Fall walking dog, lateral ankle pain

EXAM:
LEFT ANKLE COMPLETE - 3+ VIEW

[ankle ap]
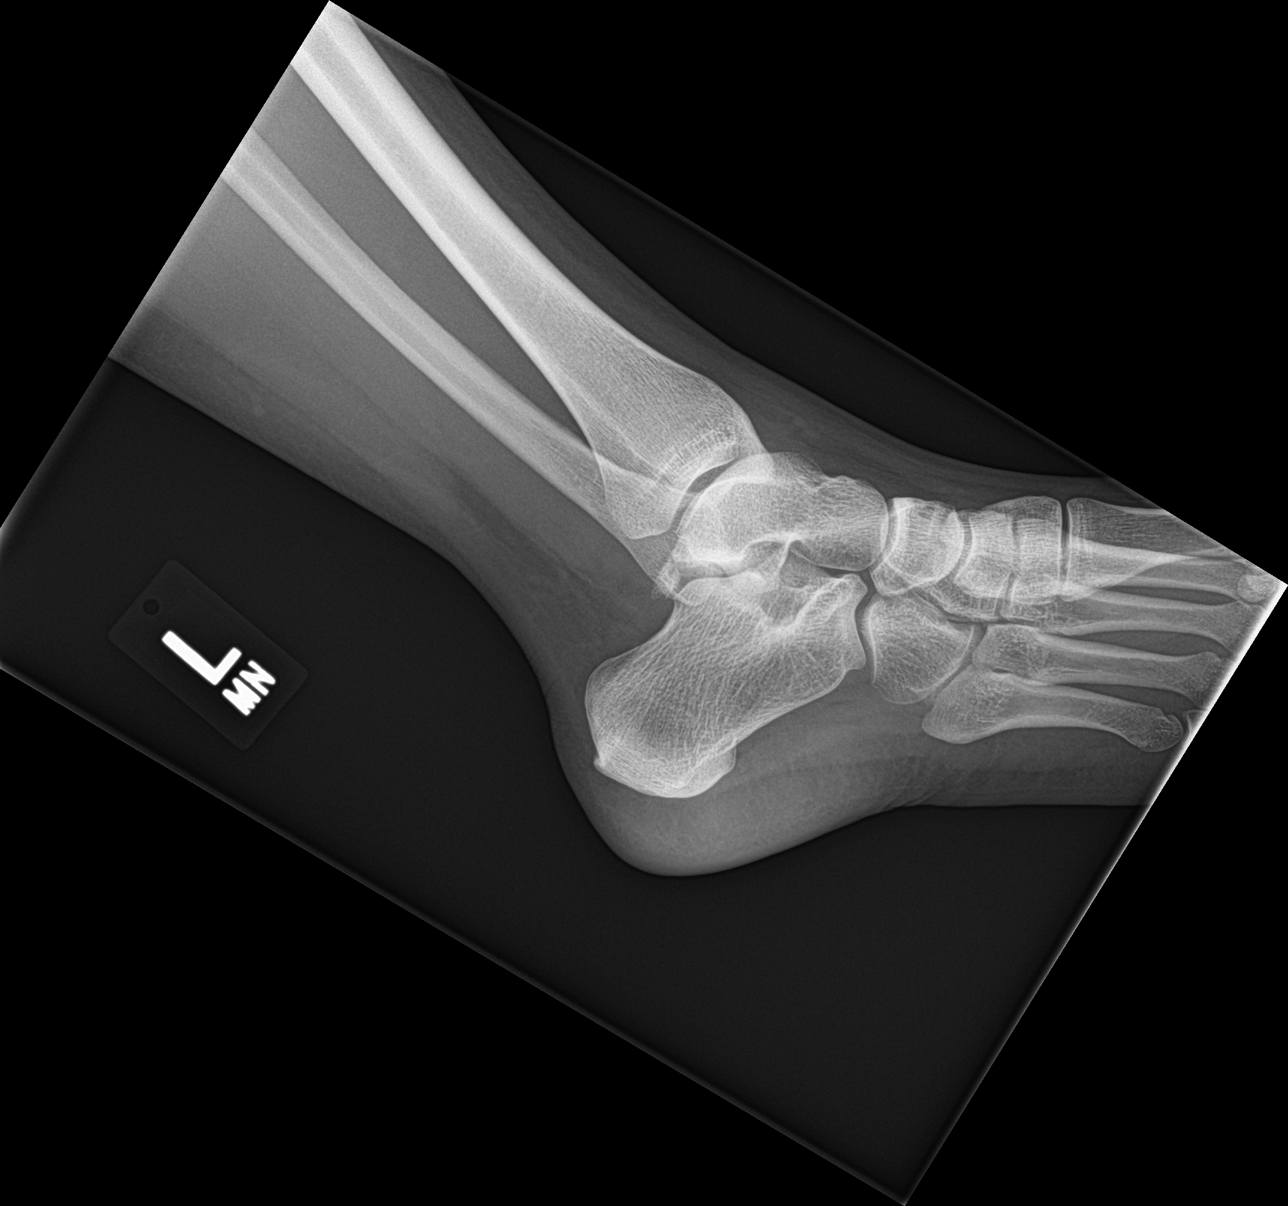

[ankle obl]
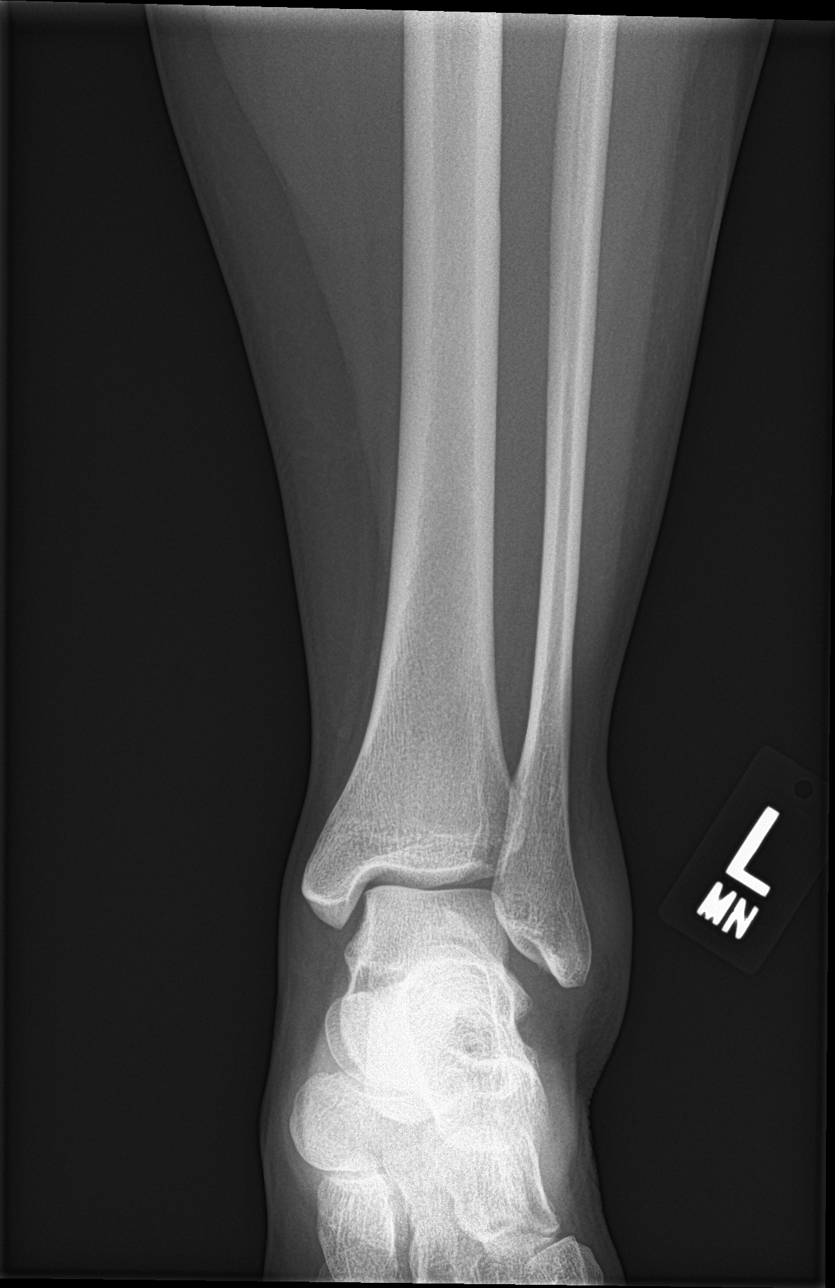

[ankle lat (1 of 2)]
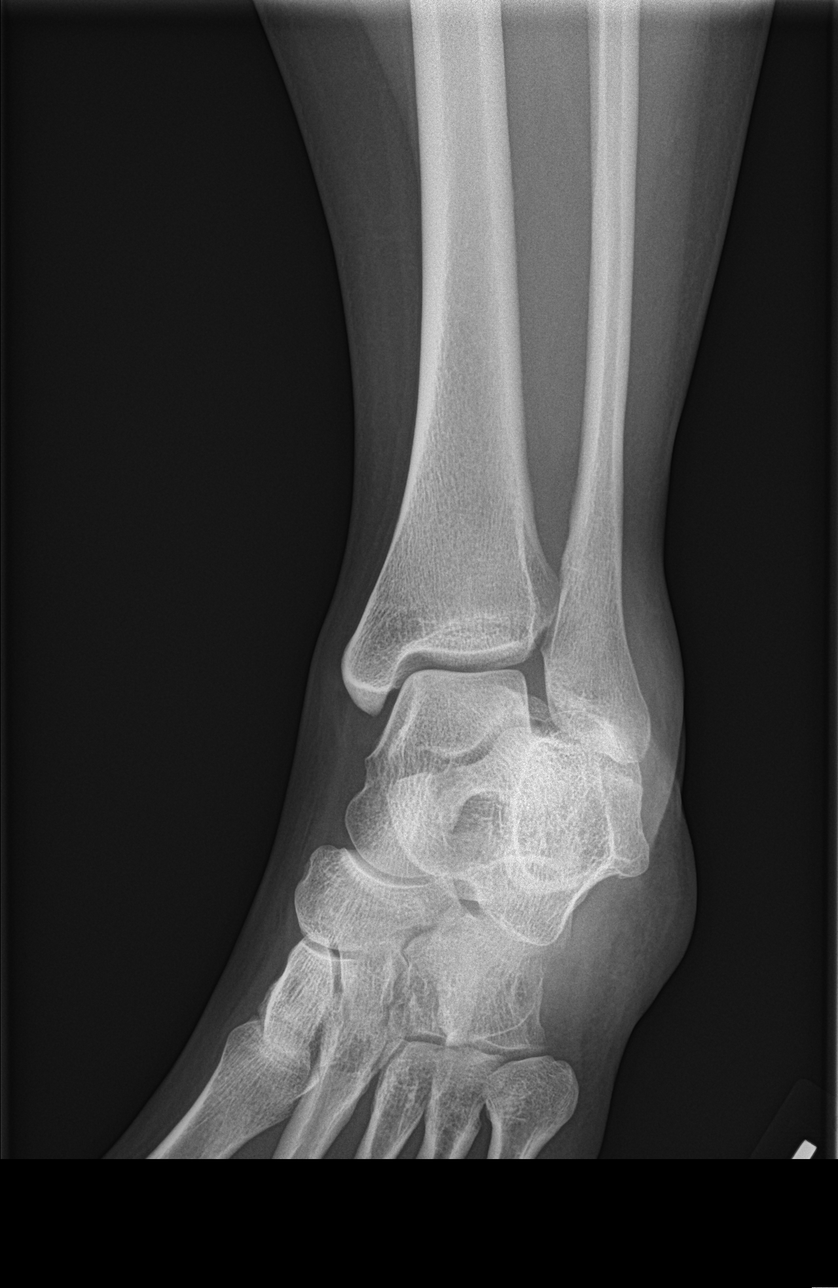

[ankle lat (2 of 2)]
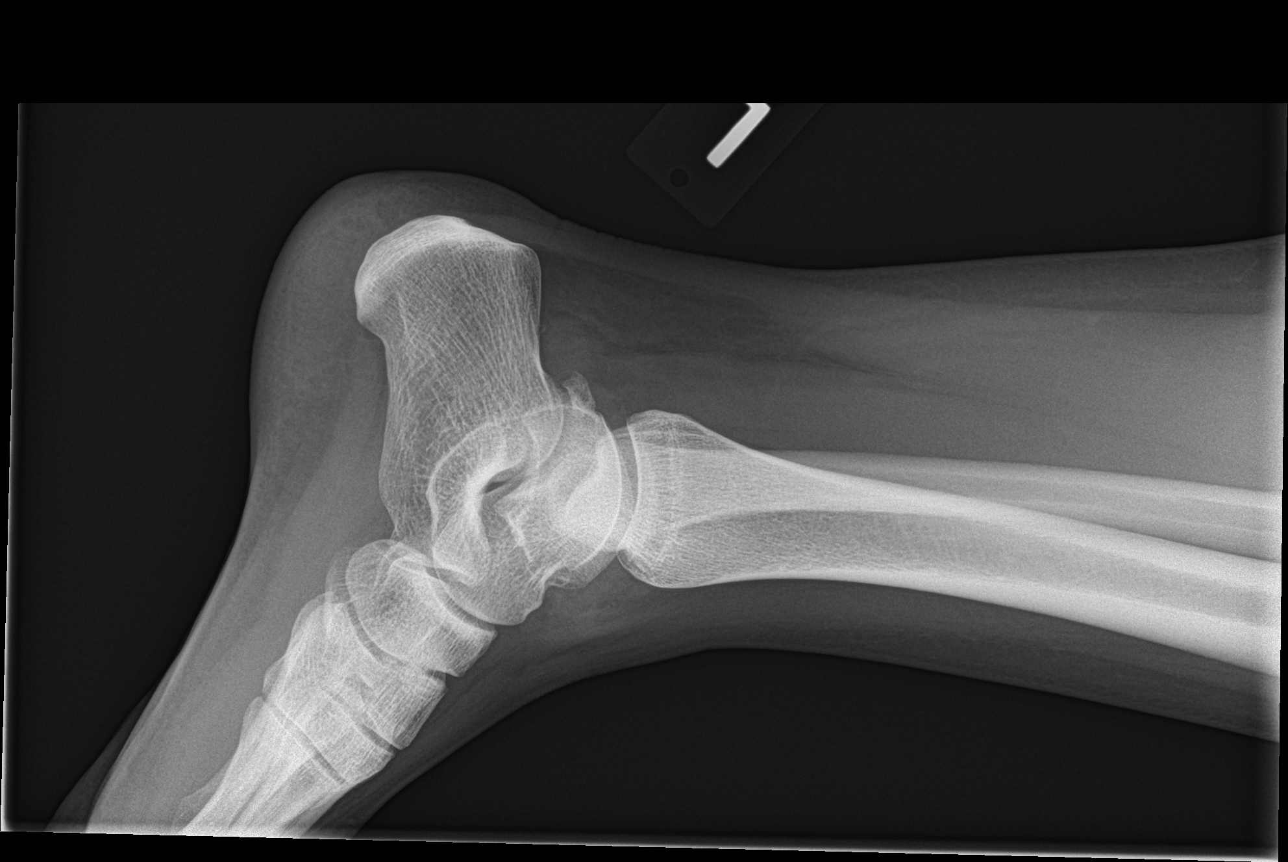

[4 of 4 positions shown; findings below may reference images not displayed]

FINDINGS: No fracture or dislocation is seen.

The ankle mortise is intact.

The base of the fifth metatarsal is unremarkable.

Mild lateral soft tissue swelling.
IMPRESSION: No fracture or dislocation is seen.

Mild lateral soft tissue swelling.

## 2020-09-04 ENCOUNTER — Other Ambulatory Visit: Payer: Self-pay | Admitting: Family Medicine

## 2020-09-04 DIAGNOSIS — R220 Localized swelling, mass and lump, head: Secondary | ICD-10-CM

## 2020-09-12 ENCOUNTER — Ambulatory Visit
Admission: RE | Admit: 2020-09-12 | Discharge: 2020-09-12 | Disposition: A | Discharge status: Home / Self Care | Payer: 59 | Source: Ambulatory Visit | Attending: Family Medicine | Admitting: Family Medicine

## 2020-09-12 DIAGNOSIS — R220 Localized swelling, mass and lump, head: Secondary | ICD-10-CM

## 2021-04-08 DIAGNOSIS — R309 Painful micturition, unspecified: Secondary | ICD-10-CM | POA: Diagnosis not present

## 2021-04-08 DIAGNOSIS — N949 Unspecified condition associated with female genital organs and menstrual cycle: Secondary | ICD-10-CM | POA: Diagnosis not present

## 2021-05-02 DIAGNOSIS — N949 Unspecified condition associated with female genital organs and menstrual cycle: Secondary | ICD-10-CM | POA: Diagnosis not present

## 2021-05-14 DIAGNOSIS — R194 Change in bowel habit: Secondary | ICD-10-CM | POA: Diagnosis not present

## 2021-05-14 DIAGNOSIS — R309 Painful micturition, unspecified: Secondary | ICD-10-CM | POA: Diagnosis not present

## 2021-05-14 DIAGNOSIS — R6889 Other general symptoms and signs: Secondary | ICD-10-CM | POA: Diagnosis not present

## 2021-05-14 DIAGNOSIS — R209 Unspecified disturbances of skin sensation: Secondary | ICD-10-CM | POA: Diagnosis not present

## 2021-05-14 DIAGNOSIS — Z832 Family history of diseases of the blood and blood-forming organs and certain disorders involving the immune mechanism: Secondary | ICD-10-CM | POA: Diagnosis not present

## 2021-05-14 DIAGNOSIS — E559 Vitamin D deficiency, unspecified: Secondary | ICD-10-CM | POA: Diagnosis not present

## 2021-08-01 DIAGNOSIS — R197 Diarrhea, unspecified: Secondary | ICD-10-CM | POA: Diagnosis not present

## 2021-08-01 DIAGNOSIS — R1110 Vomiting, unspecified: Secondary | ICD-10-CM | POA: Diagnosis not present

## 2021-08-01 DIAGNOSIS — G43109 Migraine with aura, not intractable, without status migrainosus: Secondary | ICD-10-CM | POA: Diagnosis not present

## 2021-08-19 DIAGNOSIS — L299 Pruritus, unspecified: Secondary | ICD-10-CM | POA: Diagnosis not present

## 2021-08-22 DIAGNOSIS — R768 Other specified abnormal immunological findings in serum: Secondary | ICD-10-CM | POA: Diagnosis not present

## 2021-08-22 DIAGNOSIS — M545 Low back pain, unspecified: Secondary | ICD-10-CM | POA: Diagnosis not present

## 2021-08-22 DIAGNOSIS — M79641 Pain in right hand: Secondary | ICD-10-CM | POA: Diagnosis not present

## 2021-08-22 DIAGNOSIS — M79642 Pain in left hand: Secondary | ICD-10-CM | POA: Diagnosis not present

## 2021-09-23 DIAGNOSIS — E559 Vitamin D deficiency, unspecified: Secondary | ICD-10-CM | POA: Diagnosis not present

## 2021-09-23 DIAGNOSIS — R768 Other specified abnormal immunological findings in serum: Secondary | ICD-10-CM | POA: Diagnosis not present

## 2021-09-23 DIAGNOSIS — G43109 Migraine with aura, not intractable, without status migrainosus: Secondary | ICD-10-CM | POA: Diagnosis not present

## 2021-09-23 DIAGNOSIS — R220 Localized swelling, mass and lump, head: Secondary | ICD-10-CM | POA: Diagnosis not present

## 2021-12-18 DIAGNOSIS — E559 Vitamin D deficiency, unspecified: Secondary | ICD-10-CM | POA: Diagnosis not present

## 2021-12-18 DIAGNOSIS — Z0000 Encounter for general adult medical examination without abnormal findings: Secondary | ICD-10-CM | POA: Diagnosis not present

## 2021-12-18 DIAGNOSIS — N907 Vulvar cyst: Secondary | ICD-10-CM | POA: Diagnosis not present

## 2021-12-18 DIAGNOSIS — G43109 Migraine with aura, not intractable, without status migrainosus: Secondary | ICD-10-CM | POA: Diagnosis not present

## 2021-12-18 DIAGNOSIS — Z13220 Encounter for screening for lipoid disorders: Secondary | ICD-10-CM | POA: Diagnosis not present

## 2022-03-26 DIAGNOSIS — Z62811 Personal history of psychological abuse in childhood: Secondary | ICD-10-CM | POA: Diagnosis not present

## 2022-03-26 DIAGNOSIS — F331 Major depressive disorder, recurrent, moderate: Secondary | ICD-10-CM | POA: Diagnosis not present

## 2022-03-26 DIAGNOSIS — Z638 Other specified problems related to primary support group: Secondary | ICD-10-CM | POA: Diagnosis not present

## 2022-04-02 DIAGNOSIS — F4310 Post-traumatic stress disorder, unspecified: Secondary | ICD-10-CM | POA: Diagnosis not present

## 2022-04-09 DIAGNOSIS — F4310 Post-traumatic stress disorder, unspecified: Secondary | ICD-10-CM | POA: Diagnosis not present

## 2022-04-09 IMAGING — US US SOFT TISSUE HEAD/NECK
1 series · 14 of 24 positions shown · non-contrast
Comparison: None.

CLINICAL DATA: 24-year-old female with enlarging lump above the
left ear.

EXAM:
ULTRASOUND OF HEAD/NECK SOFT TISSUES
TECHNIQUE: Ultrasound examination of the head and neck soft tissues was
performed in the area of clinical concern.

[Series 1: us soft tissue head/neck · 0.03mm/px · 24 acquisitions, 14 frames shown]
[im 1/24]
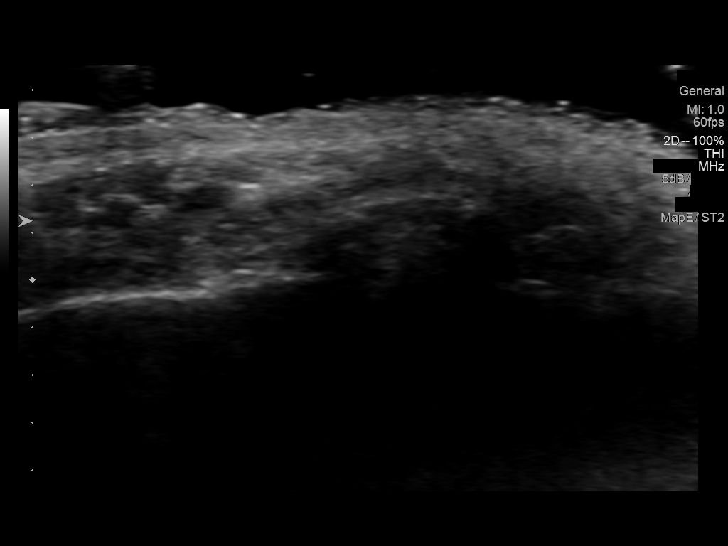
[im 3/24]
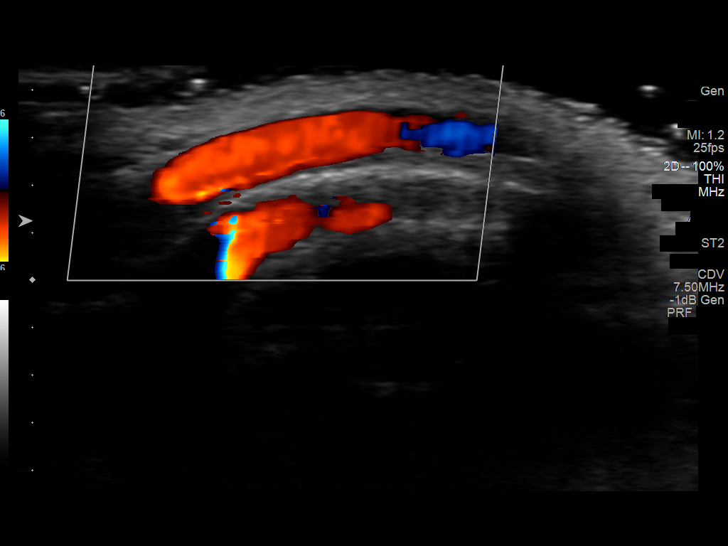
[im 5/24]
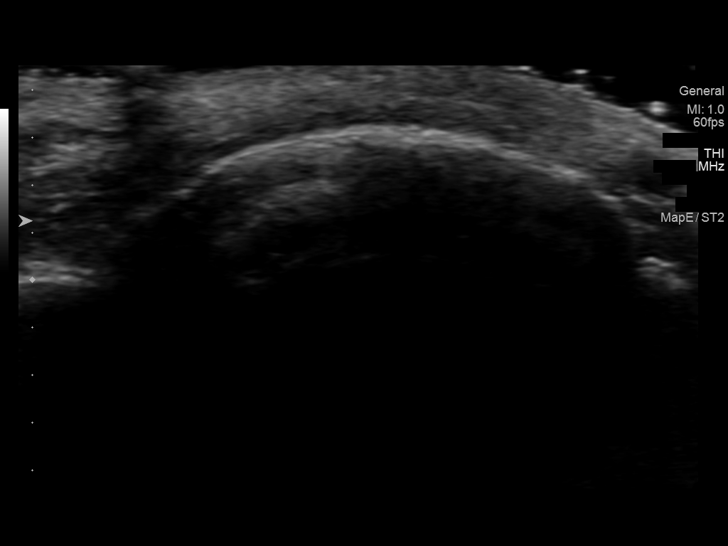
[im 7/24]
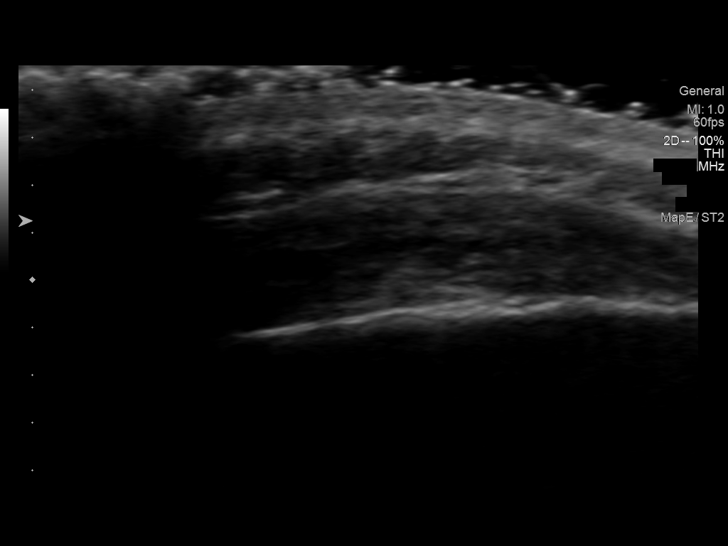
[im 8/24]
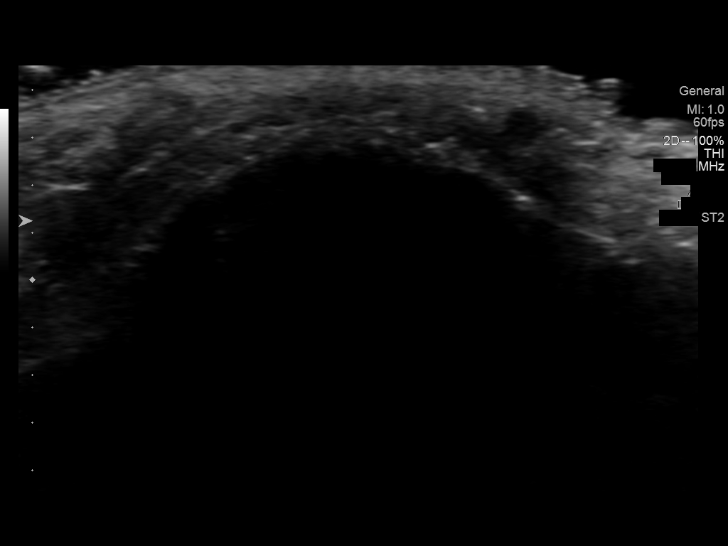
[im 10/24]
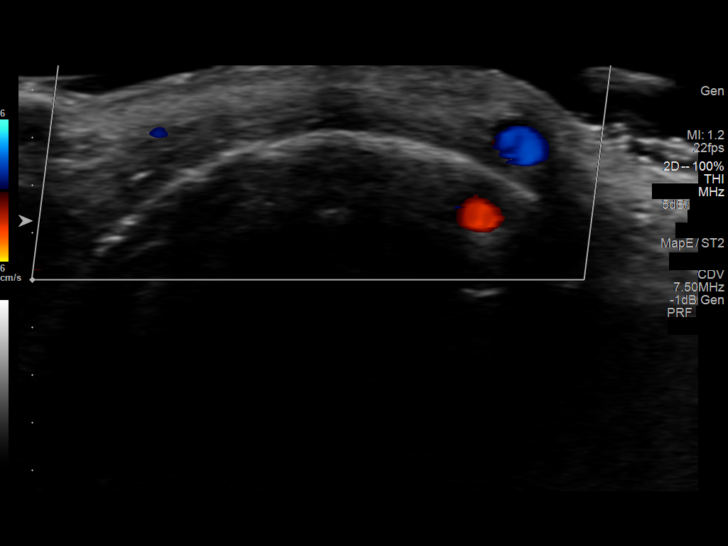
[im 12/24]
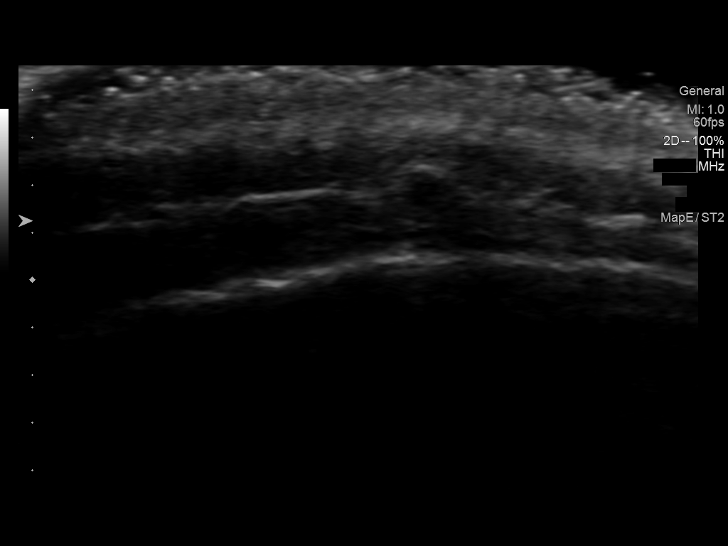
[im 13/24]
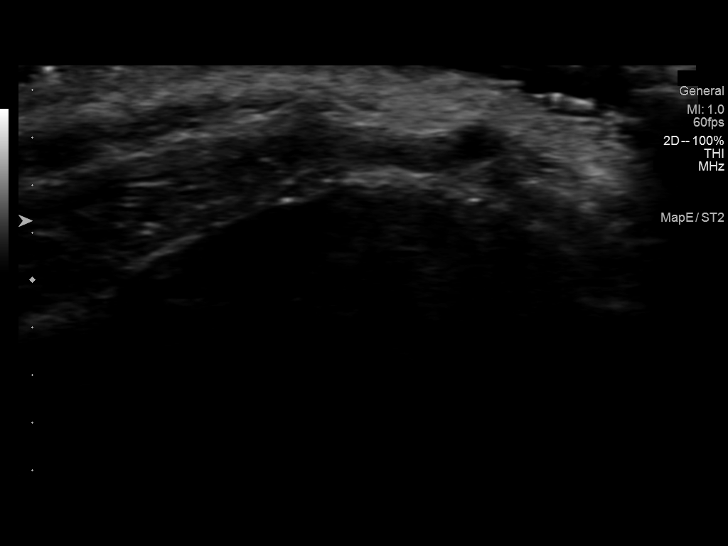
[im 15/24]
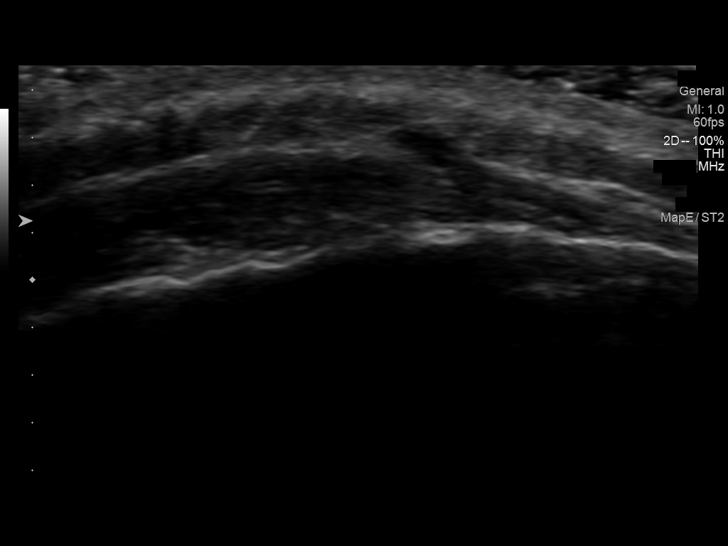
[im 17/24]
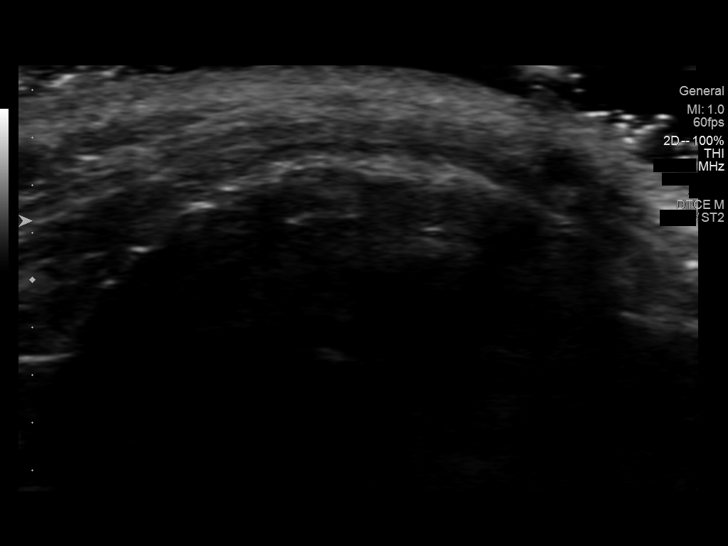
[im 19/24]
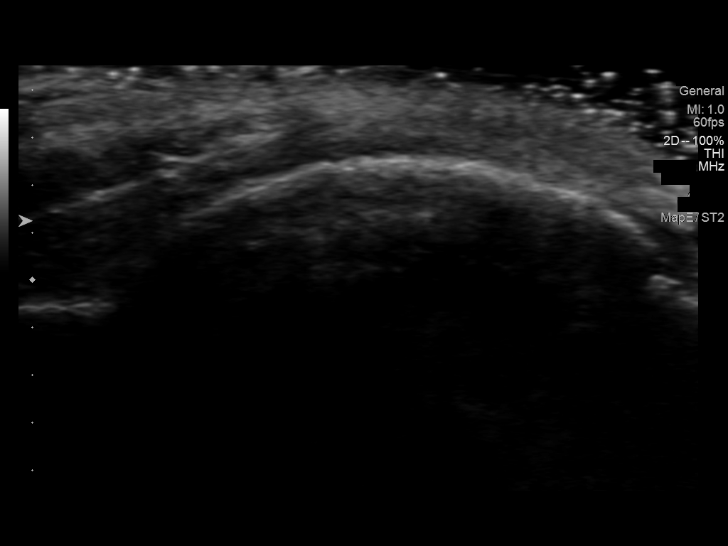
[im 20/24]
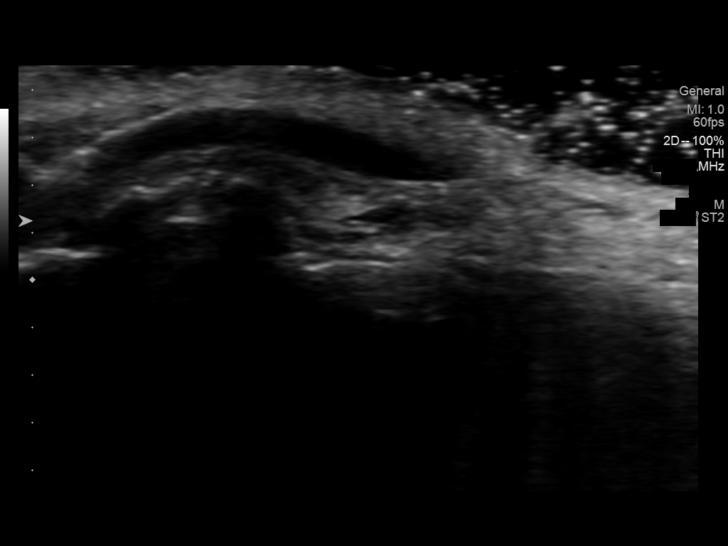
[im 22/24]
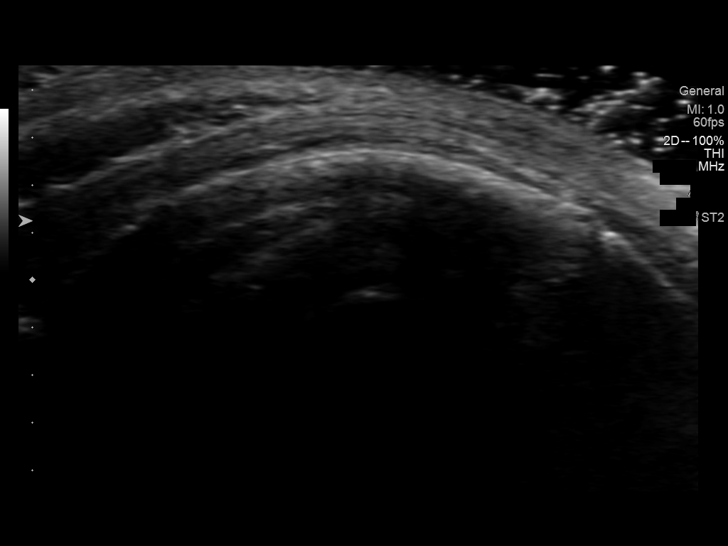
[im 24/24]
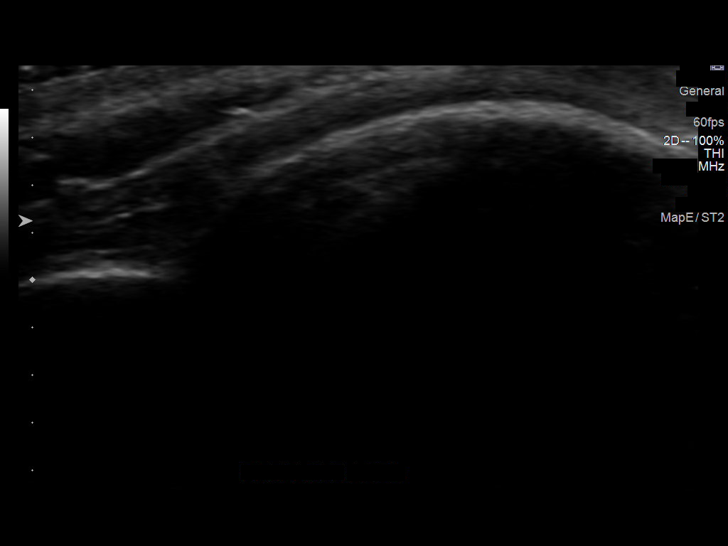

[14 of 24 positions shown; findings below may reference images not displayed]

FINDINGS: Sonographic interrogation of the region of clinical concern
demonstrates no evidence of superficial lymphadenopathy, solid mass,
cystic mass or other abnormality.
IMPRESSION: Negative sonographic survey of the region of clinical concern.

## 2022-04-16 DIAGNOSIS — F4310 Post-traumatic stress disorder, unspecified: Secondary | ICD-10-CM | POA: Diagnosis not present

## 2022-04-24 DIAGNOSIS — F4310 Post-traumatic stress disorder, unspecified: Secondary | ICD-10-CM | POA: Diagnosis not present

## 2022-05-08 DIAGNOSIS — F4310 Post-traumatic stress disorder, unspecified: Secondary | ICD-10-CM | POA: Diagnosis not present

## 2022-07-01 DIAGNOSIS — B349 Viral infection, unspecified: Secondary | ICD-10-CM | POA: Diagnosis not present

## 2022-07-01 DIAGNOSIS — J101 Influenza due to other identified influenza virus with other respiratory manifestations: Secondary | ICD-10-CM | POA: Diagnosis not present

## 2022-07-01 DIAGNOSIS — Z03818 Encounter for observation for suspected exposure to other biological agents ruled out: Secondary | ICD-10-CM | POA: Diagnosis not present

## 2022-07-01 DIAGNOSIS — J018 Other acute sinusitis: Secondary | ICD-10-CM | POA: Diagnosis not present

## 2022-07-16 NOTE — Progress Notes (Deleted)
   New Patient Visit  There were no vitals taken for this visit.   Subjective:    Patient ID: Meghan Norman, female    DOB: Jan 15, 1996, 27 y.o.   MRN: [id number]  CC: No chief complaint on file.   HPI: Meghan Norman is a 27 y.o. female presents for new patient visit to establish care.  Introduced to Designer, jewellery role and practice setting.  All questions answered.  Discussed provider/patient relationship and expectations.   Past Medical History:  Diagnosis Date   Allergy    Anxiety    Depression     Past Surgical History:  Procedure Laterality Date   WISDOM TOOTH EXTRACTION Bilateral     Family History  Problem Relation Age of Onset   Hyperlipidemia Mother    Hyperlipidemia Father    Diabetes Maternal Grandfather    Melanoma Other      Social History   Tobacco Use   Smoking status: Never   Smokeless tobacco: Never  Vaping Use   Vaping Use: Never used  Substance Use Topics   Alcohol use: Yes   Drug use: No    Current Outpatient Medications on File Prior to Visit  Medication Sig Dispense Refill   Beclomethasone Dipropionate 80 MCG/ACT AERS Place 1 application into the nose daily. 8.7 g 0   butalbital-aspirin-caffeine (FIORINAL) 50-325-40 MG capsule Take 1 capsule 2 (two) times daily as needed by mouth for headache.     cyclobenzaprine (FLEXERIL) 10 MG tablet Take 0.5-1 tablets (5-10 mg total) by mouth 3 (three) times daily as needed. (Patient not taking: Reported on 05/19/2017) 30 tablet 0   Guaifenesin (MUCINEX MAXIMUM STRENGTH) 1200 MG TB12 Take 1 tablet (1,200 mg total) by mouth every 12 (twelve) hours as needed. 14 tablet 1   naproxen (NAPROSYN) 500 MG tablet Take 1 tablet (500 mg total) by mouth 2 (two) times daily with a meal. (Patient not taking: Reported on 05/19/2017) 30 tablet 0   No current facility-administered medications on file prior to visit.     Review of Systems      Objective:    There were no vitals taken for this visit.  Wt  Readings from Last 3 Encounters:  09/09/17 185 lb (83.9 kg)  08/19/17 182 lb (82.6 kg)  05/19/17 183 lb 3.2 oz (83.1 kg)    BP Readings from Last 3 Encounters:  09/09/17 118/75  08/19/17 110/78  05/19/17 110/84    Physical Exam     Assessment & Plan:   Problem List Items Addressed This Visit   None    Follow up plan: No follow-ups on file.

## 2022-07-17 ENCOUNTER — Ambulatory Visit: Payer: Self-pay | Admitting: Nurse Practitioner

## 2022-08-20 DIAGNOSIS — R200 Anesthesia of skin: Secondary | ICD-10-CM | POA: Diagnosis not present

## 2022-08-20 DIAGNOSIS — F419 Anxiety disorder, unspecified: Secondary | ICD-10-CM | POA: Diagnosis not present

## 2022-09-09 DIAGNOSIS — M952 Other acquired deformity of head: Secondary | ICD-10-CM | POA: Diagnosis not present

## 2022-09-09 DIAGNOSIS — D164 Benign neoplasm of bones of skull and face: Secondary | ICD-10-CM | POA: Diagnosis not present

## 2022-09-10 ENCOUNTER — Encounter: Payer: Self-pay | Admitting: Otolaryngology

## 2022-09-10 ENCOUNTER — Other Ambulatory Visit: Payer: Self-pay | Admitting: Otolaryngology

## 2022-09-10 DIAGNOSIS — D164 Benign neoplasm of bones of skull and face: Secondary | ICD-10-CM

## 2022-10-29 ENCOUNTER — Ambulatory Visit: Payer: Self-pay | Admitting: Diagnostic Neuroimaging

## 2022-11-06 DIAGNOSIS — F411 Generalized anxiety disorder: Secondary | ICD-10-CM | POA: Diagnosis not present

## 2022-11-12 DIAGNOSIS — F411 Generalized anxiety disorder: Secondary | ICD-10-CM | POA: Diagnosis not present

## 2022-12-10 DIAGNOSIS — F411 Generalized anxiety disorder: Secondary | ICD-10-CM | POA: Diagnosis not present

## 2023-09-20 ENCOUNTER — Other Ambulatory Visit: Payer: Self-pay

## 2023-09-20 ENCOUNTER — Ambulatory Visit: Payer: Self-pay | Admitting: Physician Assistant

## 2023-09-20 ENCOUNTER — Encounter: Payer: Self-pay | Admitting: Physician Assistant

## 2023-09-20 ENCOUNTER — Telehealth: Payer: Self-pay

## 2023-09-20 VITALS — BP 115/72 | HR 120 | Ht 60.0 in | Wt 143.0 lb

## 2023-09-20 DIAGNOSIS — K219 Gastro-esophageal reflux disease without esophagitis: Secondary | ICD-10-CM

## 2023-09-20 DIAGNOSIS — F5104 Psychophysiologic insomnia: Secondary | ICD-10-CM | POA: Diagnosis not present

## 2023-09-20 DIAGNOSIS — D164 Benign neoplasm of bones of skull and face: Secondary | ICD-10-CM | POA: Diagnosis not present

## 2023-09-20 DIAGNOSIS — Z862 Personal history of diseases of the blood and blood-forming organs and certain disorders involving the immune mechanism: Secondary | ICD-10-CM

## 2023-09-20 DIAGNOSIS — F411 Generalized anxiety disorder: Secondary | ICD-10-CM

## 2023-09-20 DIAGNOSIS — E559 Vitamin D deficiency, unspecified: Secondary | ICD-10-CM

## 2023-09-20 DIAGNOSIS — R110 Nausea: Secondary | ICD-10-CM

## 2023-09-20 DIAGNOSIS — L309 Dermatitis, unspecified: Secondary | ICD-10-CM

## 2023-09-20 MED ORDER — TRIAMCINOLONE ACETONIDE 0.1 % EX CREA
1.0000 | TOPICAL_CREAM | Freq: Two times a day (BID) | CUTANEOUS | 0 refills | Status: DC
  Filled 2023-09-20: qty 30, 15d supply, fill #0

## 2023-09-20 MED ORDER — HYDROXYZINE HCL 25 MG PO TABS
12.5000 mg | ORAL_TABLET | Freq: Three times a day (TID) | ORAL | 0 refills | Status: DC | PRN
  Filled 2023-09-20: qty 30, 10d supply, fill #0

## 2023-09-20 MED ORDER — OMEPRAZOLE 20 MG PO CPDR
20.0000 mg | DELAYED_RELEASE_CAPSULE | Freq: Every day | ORAL | 3 refills | Status: DC
  Filled 2023-09-20: qty 30, 30d supply, fill #0

## 2023-09-20 MED ORDER — ONDANSETRON 4 MG PO TBDP
4.0000 mg | ORAL_TABLET | Freq: Three times a day (TID) | ORAL | 0 refills | Status: AC | PRN
  Filled 2023-09-20: qty 20, 7d supply, fill #0

## 2023-09-20 NOTE — Patient Instructions (Addendum)
 VISIT SUMMARY:  During today's visit, we discussed several health concerns, including a growth on your head, digestive issues, anxiety and sleep disturbances, and new skin changes. We have developed a comprehensive plan to address each of these issues and improve your overall well-being.  YOUR PLAN:  -HEAD AND LEG MASSES: You have a growth on your head that has increased in size and is causing your glasses to sit crooked. This may be affecting the nerves in your face, leading to numbness. We will order a CT scan of your head and perform comprehensive lab work to investigate further. You will also apply for financial assistance through [facility].  -ANXIETY AND SLEEP DISTURBANCE: Your anxiety and difficulty sleeping are likely contributing to your physical symptoms. We will prescribe hydroxyzine to help improve your sleep and reduce anxiety. Additionally, please increase your water intake to 64 ounces daily.  -GASTROINTESTINAL ISSUES AND CONSTIPATION: You have been experiencing chronic constipation, occasional black stools, and nausea. These symptoms may be related to low vitamin D and iron levels. We will prescribe Prilosec for acid reflux and Zofran for nausea. Please take a daily multivitamin and increase your water intake to 64 ounces daily.  -SKIN CHANGES: You have noticed new skin changes, which are likely benign. We will prescribe a steroid cream to help with these changes. Please monitor the skin changes with photographs to track any progress.   We will call you with todays lab results and appt for CT  Roney Jaffe, PA-C Physician Assistant [facility] [email or web address]  Insomnia Insomnia is a sleep disorder that makes it difficult to fall asleep or stay asleep. Insomnia can cause fatigue, low energy, difficulty concentrating, mood swings, and poor performance at work or school. There are three different ways to classify  insomnia: Difficulty falling asleep. Difficulty staying asleep. Waking up too early in the morning. Any type of insomnia can be long-term (chronic) or short-term (acute). Both are common. Short-term insomnia usually lasts for 3 months or less. Chronic insomnia occurs at least three times a week for longer than 3 months. What are the causes? Insomnia may be caused by another condition, situation, or substance, such as: Having certain mental health conditions, such as anxiety and depression. Using caffeine, alcohol, tobacco, or drugs. Having gastrointestinal conditions, such as gastroesophageal reflux disease (GERD). Having certain medical conditions. These include: Asthma. Alzheimer's disease. Stroke. Chronic pain. An overactive thyroid gland (hyperthyroidism). Other sleep disorders, such as restless legs syndrome and sleep apnea. Menopause. Sometimes, the cause of insomnia may not be known. What increases the risk? Risk factors for insomnia include: Gender. Females are affected more often than males. Age. Insomnia is more common as people get older. Stress and certain medical and mental health conditions. Lack of exercise. Having an irregular work schedule. This may include working night shifts and traveling between different time zones. What are the signs or symptoms? If you have insomnia, the main symptom is having trouble falling asleep or having trouble staying asleep. This may lead to other symptoms, such as: Feeling tired or having low energy. Feeling nervous about going to sleep. Not feeling rested in the morning. Having trouble concentrating. Feeling irritable, anxious, or depressed. How is this diagnosed? This condition may be diagnosed based on: Your symptoms and medical history. Your health care provider may ask about: Your sleep habits. Any medical conditions you have. Your mental health. A physical exam. How is this treated? Treatment for insomnia depends on the  cause. Treatment may focus on  treating an underlying condition that is causing the insomnia. Treatment may also include: Medicines to help you sleep. Counseling or therapy. Lifestyle adjustments to help you sleep better. Follow these instructions at home: Eating and drinking  Limit or avoid alcohol, caffeinated beverages, and products that contain nicotine and tobacco, especially close to bedtime. These can disrupt your sleep. Do not eat a large meal or eat spicy foods right before bedtime. This can lead to digestive discomfort that can make it hard for you to sleep. Sleep habits  Keep a sleep diary to help you and your health care provider figure out what could be causing your insomnia. Write down: When you sleep. When you wake up during the night. How well you sleep and how rested you feel the next day. Any side effects of medicines you are taking. What you eat and drink. Make your bedroom a dark, comfortable place where it is easy to fall asleep. Put up shades or blackout curtains to block light from outside. Use a white noise machine to block noise. Keep the temperature cool. Limit screen use before bedtime. This includes: Not watching TV. Not using your smartphone, tablet, or computer. Stick to a routine that includes going to bed and waking up at the same times every day and night. This can help you fall asleep faster. Consider making a quiet activity, such as reading, part of your nighttime routine. Try to avoid taking naps during the day so that you sleep better at night. Get out of bed if you are still awake after 15 minutes of trying to sleep. Keep the lights down, but try reading or doing a quiet activity. When you feel sleepy, go back to bed. General instructions Take over-the-counter and prescription medicines only as told by your health care provider. Exercise regularly as told by your health care provider. However, avoid exercising in the hours right before bedtime. Use  relaxation techniques to manage stress. Ask your health care provider to suggest some techniques that may work well for you. These may include: Breathing exercises. Routines to release muscle tension. Visualizing peaceful scenes. Make sure that you drive carefully. Do not drive if you feel very sleepy. Keep all follow-up visits. This is important. Contact a health care provider if: You are tired throughout the day. You have trouble in your daily routine due to sleepiness. You continue to have sleep problems, or your sleep problems get worse. Get help right away if: You have thoughts about hurting yourself or someone else. Get help right away if you feel like you may hurt yourself or others, or have thoughts about taking your own life. Go to your nearest emergency room or: Call 911. Call the National Suicide Prevention Lifeline at 347 257 0454 or 988. This is open 24 hours a day. Text the Crisis Text Line at (305)562-6876. Summary Insomnia is a sleep disorder that makes it difficult to fall asleep or stay asleep. Insomnia can be long-term (chronic) or short-term (acute). Treatment for insomnia depends on the cause. Treatment may focus on treating an underlying condition that is causing the insomnia. Keep a sleep diary to help you and your health care provider figure out what could be causing your insomnia. This information is not intended to replace advice given to you by your health care provider. Make sure you discuss any questions you have with your health care provider. Document Revised: 06/09/2021 Document Reviewed: 06/09/2021 Elsevier Patient Education  2024 [employer].

## 2023-09-20 NOTE — Telephone Encounter (Signed)
 Patient, has been scheduled for  CT HEAD WO CONTRAST ( )  Monday 3.17.2025 at 4pm, she has been informed of appointment information, and details of the procedure.

## 2023-09-20 NOTE — Progress Notes (Signed)
 New Patient Office Visit  Subjective    Patient ID: Meghan Norman, female    DOB: 10-17-1995  Age: 28 y.o. MRN: [id number]  CC:  Chief Complaint  Patient presents with   Mass   Discussed the use of AI scribe software for clinical note transcription with the patient, who gave verbal consent to proceed.  History of Present Illness         The patient,  with a history of vitamin deficiencies and eating disorders, presents with multiple concerns. The chief complaint is a growth on the side of her head, first noticed four years ago by a hairdresser. The growth was initially small and an ultrasound at the time was inconclusive. Over the past year or two, the patient noticed the growth has increased in size, causing her glasses to sit crooked on her face. The patient also reports new skin changes, including a patch of dry skin and a small round lesion.  The patient also reports digestive issues for the past two years, including constipation and nausea, particularly when hungry or needing to use the bathroom. She has a history of low vitamin D and iron levels and has been trying to improve her diet and hydration.  The patient has been experiencing anxiety, which she believes is exacerbating her physical symptoms. She reports difficulty sleeping, getting only four to five hours of sleep per night. She has tried various remedies for sleep, including THC, valerian, and melatonin, but none have been consistently effective.  The patient also reports numbness in her upper lip for the past year, which a dentist suggested might be due to the growth on her head pressing on a nerve. The numbness has recently spread to her chin, forehead, and into her neck and shoulder, although it has started to alleviate in the past few days.  The patient has lost significant weight in the past two years, dropping from 185 lbs to 127 lbs, but has recently regained 20 lbs due to stress eating. She also reports joint aches  and weakness.  Plan from 09/09/22 Head and Neck Surgery evaluation  RECOMMENDATIONS:  I recommend starting with a CT of the head. If confirms osteoma would offer surgical excision via temporal scalp approach. Surgery is indicated due to deformity and difficulty with wearing glasses due to the mass disrupting the placement of her glasses the root of the helix. Will call patient after CT completed.  Orders Placed This Encounter  Procedures   CT Head Wo Contrast   No orders of the defined types were placed in this encounter.   Medical Decision Making:  #/Compl Problems 3 Data Rev 2 Management 2  (1-Straightforward, 2-Low, 3- Moderate, 4-High)  All questions were answered. Patient is amenable to plan as discussed above.  Thank you for this kind consultation of this patient to our department, and we feel honored to be of service. Please feel free to contact me at any point with to discuss or answer any questions pertaining to this patient or any other matter.   Sincerely,  Electronically signed by:  Scarlette Ar, MD  Staff Physician Facial Plastic & Reconstructive Surgery Otolaryngology - Head and Neck Surgery [facility]   Electronically signed by: Dione Housekeeper, MD 09/09/22 [phone]   Outpatient Encounter Medications as of 09/20/2023  Medication Sig   hydrOXYzine (ATARAX) 25 MG tablet Take 0.5-1 tablets (12.5-25 mg total) by mouth every 8 (eight) hours as needed.  omeprazole (PRILOSEC) 20 MG capsule Take 1 capsule (20 mg total) by mouth daily.   ondansetron (ZOFRAN-ODT) 4 MG disintegrating tablet Take 1 tablet (4 mg total) by mouth every 8 (eight) hours as needed for nausea or vomiting.   triamcinolone cream (KENALOG) 0.1 % Apply 1 Application topically 2 (two) times daily.   [DISCONTINUED] Beclomethasone Dipropionate 80 MCG/ACT AERS Place 1 application into the nose daily.   [DISCONTINUED]  butalbital-aspirin-caffeine (FIORINAL) 50-325-40 MG capsule Take 1 capsule 2 (two) times daily as needed by mouth for headache.   [DISCONTINUED] cyclobenzaprine (FLEXERIL) 10 MG tablet Take 0.5-1 tablets (5-10 mg total) by mouth 3 (three) times daily as needed. (Patient not taking: Reported on 05/19/2017)   [DISCONTINUED] Guaifenesin (MUCINEX MAXIMUM STRENGTH) 1200 MG TB12 Take 1 tablet (1,200 mg total) by mouth every 12 (twelve) hours as needed.   [DISCONTINUED] naproxen (NAPROSYN) 500 MG tablet Take 1 tablet (500 mg total) by mouth 2 (two) times daily with a meal. (Patient not taking: Reported on 05/19/2017)   No facility-administered encounter medications on file as of 09/20/2023.    Past Medical History:  Diagnosis Date   Allergy    Anxiety    Depression     Past Surgical History:  Procedure Laterality Date   WISDOM TOOTH EXTRACTION Bilateral     Family History  Problem Relation Age of Onset   Hyperlipidemia Mother    Hyperlipidemia Father    Diabetes Maternal Grandfather    Melanoma Other     Social History   Socioeconomic History   Marital status: Single    Spouse name: Not on file   Number of children: Not on file   Years of education: Not on file   Highest education level: Not on file  Occupational History   Not on file  Tobacco Use   Smoking status: Never   Smokeless tobacco: Never  Vaping Use   Vaping status: Never Used  Substance and Sexual Activity   Alcohol use: Yes   Drug use: No   Sexual activity: Yes    Birth control/protection: None  Other Topics Concern   Not on file  Social History Narrative   Not on file   Social Drivers of Health   Financial Resource Strain: Not on file  Food Insecurity: Not on file  Transportation Needs: Not on file  Physical Activity: Not on file  Stress: Not on file  Social Connections: Not on file  Intimate Partner Violence: Not on file    Review of Systems  Constitutional: Negative.   HENT: Negative.    Eyes:  Negative.   Respiratory:  Negative for shortness of breath.   Cardiovascular:  Negative for chest pain.  Gastrointestinal:  Positive for constipation and heartburn.  Genitourinary: Negative.   Musculoskeletal: Negative.   Skin:  Positive for rash.  Neurological: Negative.   Endo/Heme/Allergies: Negative.   Psychiatric/Behavioral: Negative.          Objective    BP 115/72 (BP Location: Left Arm)   Pulse (!) 120   Ht 5' (1.524 m)   Wt 143 lb (64.9 kg)   BMI 27.93 kg/m   Physical Exam Vitals and nursing note reviewed.  Constitutional:      Appearance: Normal appearance.  HENT:     Head: Normocephalic and atraumatic. Mass present.      Comments: Erythema noted; see photo   ~2cm firm  lesion. Non-tender.   Small lesion noted on left shin area, and one noted under right knee  Right Ear: External ear normal.     Left Ear: External ear normal.     Nose: Nose normal.     Mouth/Throat:     Mouth: Mucous membranes are moist.     Pharynx: Oropharynx is clear.  Eyes:     Extraocular Movements: Extraocular movements intact.     Conjunctiva/sclera: Conjunctivae normal.     Pupils: Pupils are equal, round, and reactive to light.  Cardiovascular:     Rate and Rhythm: Normal rate and regular rhythm.     Pulses: Normal pulses.     Heart sounds: Normal heart sounds.  Pulmonary:     Effort: Pulmonary effort is normal.     Breath sounds: Normal breath sounds.  Musculoskeletal:        General: Normal range of motion.     Cervical back: Normal range of motion and neck supple.  Skin:    General: Skin is warm and dry.          Comments: See photos  Dry patch without erythema noted left shin area   Neurological:     General: No focal deficit present.     Mental Status: She is alert and oriented to person, place, and time.  Psychiatric:        Mood and Affect: Mood normal.        Behavior: Behavior normal.        Thought Content: Thought content normal.        Judgment:  Judgment normal.                  Assessment & Plan:   Problem List Items Addressed This Visit   None Visit Diagnoses       Osteoma of skull    -  Primary   Relevant Orders   CT HEAD WO CONTRAST ( )     Anxiety state       Relevant Medications   hydrOXYzine (ATARAX) 25 MG tablet   Other Relevant Orders   Comp. Metabolic Panel (12)   TSH     Psychophysiological insomnia       Relevant Medications   hydrOXYzine (ATARAX) 25 MG tablet     Dermatitis       Relevant Medications   triamcinolone cream (KENALOG) 0.1 %     Gastroesophageal reflux disease without esophagitis       Relevant Medications   omeprazole (PRILOSEC) 20 MG capsule   ondansetron (ZOFRAN-ODT) 4 MG disintegrating tablet     Nausea without vomiting       Relevant Medications   ondansetron (ZOFRAN-ODT) 4 MG disintegrating tablet     Vitamin D deficiency       Relevant Orders   Vitamin D, 25-hydroxy     History of anemia       Relevant Orders   CBC with Differential/Platelet   Iron, TIBC and Ferritin Panel      1. Osteoma of skull (Primary) Growth on head increasing in size, causing glasses to sit crooked. Possible nerve involvement due to numbness in upper lip and face. Similar mass on leg. Previous ENT suggested calcium deposit. - Order CT scan of the head. - Perform comprehensive lab work. - Apply for [facility] financial assistance. Patient to return to [facility] in 2 weeks for f/u - CT HEAD WO CONTRAST ( ); Future  2. Anxiety state Significant anxiety and sleep disturbance, exacerbated by health concerns. Limited success with valerian root. Hydroxyzine proposed to improve sleep and reduce anxiety. - Prescribe  hydroxyzine for sleep. - Encourage increased water intake to 64 ounces daily. - Comp. Metabolic Panel (12) - TSH - hydrOXYzine (ATARAX) 25 MG tablet; Take 0.5-1 tablets (12.5-25 mg total) by mouth every 8 (eight) hours as needed.  Dispense: 30 tablet; Refill: 0  3.  Psychophysiological insomnia  - hydrOXYzine (ATARAX) 25 MG tablet; Take 0.5-1 tablets (12.5-25 mg total) by mouth every 8 (eight) hours as needed.  Dispense: 30 tablet; Refill: 0  4. Dermatitis Trial Kenalog - triamcinolone cream (KENALOG) 0.1 %; Apply 1 Application topically 2 (two) times daily.  Dispense: 30 g; Refill: 0  5. Gastroesophageal reflux disease without esophagitis Trial Prilosec - omeprazole (PRILOSEC) 20 MG capsule; Take 1 capsule (20 mg total) by mouth daily.  Dispense: 30 capsule; Refill: 3  6. Nausea without vomiting Trial Zofran - ondansetron (ZOFRAN-ODT) 4 MG disintegrating tablet; Take 1 tablet (4 mg total) by mouth every 8 (eight) hours as needed for nausea or vomiting.  Dispense: 20 tablet; Refill: 0  7. Vitamin D deficiency  - Vitamin D, 25-hydroxy  8. History of anemia  - CBC with Differential/Platelet - Iron, TIBC and Ferritin Panel   I have reviewed the patient's medical history (PMH, PSH, Social History, Family History, Medications, and allergies) , and have been updated if relevant. I spent 30 minutes reviewing chart and  face to face time with patient.    Return in about 2 weeks (around 10/04/2023) for With [facility].   Kasandra Knudsen Mayers, PA-C

## 2023-09-21 LAB — COMP. METABOLIC PANEL (12)
AST: 17 IU/L (ref 0–40)
Albumin: 4.7 g/dL (ref 4.0–5.0)
Alkaline Phosphatase: 62 IU/L (ref 44–121)
BUN/Creatinine Ratio: 20 (ref 9–23)
BUN: 13 mg/dL (ref 6–20)
Bilirubin Total: 0.6 mg/dL (ref 0.0–1.2)
Calcium: 9.9 mg/dL (ref 8.7–10.2)
Chloride: 99 mmol/L (ref 96–106)
Creatinine, Ser: 0.65 mg/dL (ref 0.57–1.00)
Globulin, Total: 2.4 g/dL (ref 1.5–4.5)
Glucose: 77 mg/dL (ref 70–99)
Potassium: 3.8 mmol/L (ref 3.5–5.2)
Sodium: 138 mmol/L (ref 134–144)
Total Protein: 7.1 g/dL (ref 6.0–8.5)
eGFR: 123 mL/min/{1.73_m2} (ref >59)

## 2023-09-21 LAB — CBC WITH DIFFERENTIAL/PLATELET
Basophils Absolute: 0 10*3/uL (ref 0.0–0.2)
Basos: 0 %
EOS (ABSOLUTE): 0 10*3/uL (ref 0.0–0.4)
Eos: 0 %
Hematocrit: 47 % — ABNORMAL HIGH (ref 34.0–46.6)
Hemoglobin: 15.7 g/dL (ref 11.1–15.9)
Immature Grans (Abs): 0 10*3/uL (ref 0.0–0.1)
Immature Granulocytes: 0 %
Lymphocytes Absolute: 0.9 10*3/uL (ref 0.7–3.1)
Lymphs: 11 %
MCH: 30.1 pg (ref 26.6–33.0)
MCHC: 33.4 g/dL (ref 31.5–35.7)
MCV: 90 fL (ref 79–97)
Monocytes Absolute: 0.4 10*3/uL (ref 0.1–0.9)
Monocytes: 5 %
Neutrophils Absolute: 7.4 10*3/uL — ABNORMAL HIGH (ref 1.4–7.0)
Neutrophils: 84 %
Platelets: 261 10*3/uL (ref 150–450)
RBC: 5.21 x10E6/uL (ref 3.77–5.28)
RDW: 12.1 % (ref 11.7–15.4)
WBC: 8.8 10*3/uL (ref 3.4–10.8)

## 2023-09-21 LAB — IRON,TIBC AND FERRITIN PANEL
Ferritin: 114 ng/mL (ref 15–150)
Iron Saturation: 29 % (ref 15–55)
Iron: 82 ug/dL (ref 27–159)
Total Iron Binding Capacity: 279 ug/dL (ref 250–450)
UIBC: 197 ug/dL (ref 131–425)

## 2023-09-21 LAB — TSH: TSH: 0.933 u[IU]/mL (ref 0.450–4.500)

## 2023-09-21 LAB — VITAMIN D 25 HYDROXY (VIT D DEFICIENCY, FRACTURES): Vit D, 25-Hydroxy: 19.8 ng/mL — ABNORMAL LOW (ref 30.0–100.0)

## 2023-09-23 ENCOUNTER — Ambulatory Visit (HOSPITAL_COMMUNITY)

## 2023-09-27 ENCOUNTER — Ambulatory Visit (HOSPITAL_COMMUNITY)
Admission: RE | Admit: 2023-09-27 | Discharge: 2023-09-27 | Disposition: A | Discharge status: Home / Self Care | Payer: Self-pay | Source: Ambulatory Visit | Attending: Physician Assistant | Admitting: Physician Assistant

## 2023-09-27 DIAGNOSIS — D164 Benign neoplasm of bones of skull and face: Secondary | ICD-10-CM | POA: Insufficient documentation

## 2023-09-29 ENCOUNTER — Other Ambulatory Visit: Payer: Self-pay

## 2023-10-18 ENCOUNTER — Encounter: Payer: Self-pay | Admitting: Physician Assistant

## 2023-10-19 ENCOUNTER — Other Ambulatory Visit: Payer: Self-pay | Admitting: Physician Assistant

## 2023-10-19 DIAGNOSIS — D164 Benign neoplasm of bones of skull and face: Secondary | ICD-10-CM

## 2023-10-20 ENCOUNTER — Other Ambulatory Visit: Payer: Self-pay | Admitting: Physician Assistant

## 2023-10-20 DIAGNOSIS — D164 Benign neoplasm of bones of skull and face: Secondary | ICD-10-CM

## 2023-11-18 ENCOUNTER — Encounter: Payer: Self-pay | Admitting: Dermatology

## 2023-11-18 ENCOUNTER — Ambulatory Visit: Payer: Self-pay | Admitting: Dermatology

## 2023-11-18 VITALS — BP 103/75 | HR 89

## 2023-11-18 DIAGNOSIS — L578 Other skin changes due to chronic exposure to nonionizing radiation: Secondary | ICD-10-CM

## 2023-11-18 DIAGNOSIS — Z1283 Encounter for screening for malignant neoplasm of skin: Secondary | ICD-10-CM

## 2023-11-18 DIAGNOSIS — L821 Other seborrheic keratosis: Secondary | ICD-10-CM

## 2023-11-18 DIAGNOSIS — L700 Acne vulgaris: Secondary | ICD-10-CM

## 2023-11-18 DIAGNOSIS — L814 Other melanin hyperpigmentation: Secondary | ICD-10-CM

## 2023-11-18 DIAGNOSIS — W908XXA Exposure to other nonionizing radiation, initial encounter: Secondary | ICD-10-CM

## 2023-11-18 DIAGNOSIS — D1801 Hemangioma of skin and subcutaneous tissue: Secondary | ICD-10-CM

## 2023-11-18 NOTE — Patient Instructions (Signed)

## 2023-11-18 NOTE — Progress Notes (Signed)
   New Patient Visit   Subjective  Meghan Norman is a 28 y.o. female who presents for the following:  Total Body Skin Exam (TBSE)  Patient present today for new patient visit for TBSE.The patient reports he has spots, moles and lesions to be evaluated, some may be new or changing. Patient has previously been treated by a dermatologist.Patient reports he does hx of bx. Patient denies family history of skin cancers. Patient reports throughout her lifetime has had minimal sun exposure. Currently, patient reports if he  has excessive sun exposure, she does apply sunscreen and/or wears protective coverings.  The following portions of the chart were reviewed this encounter and updated as appropriate: medications, allergies, medical history  Review of Systems:  No other skin or systemic complaints except as noted in HPI or Assessment and Plan.  Objective  Well appearing patient in no apparent distress; mood and affect are within normal limits.  A full examination was performed including scalp, head, eyes, ears, nose, lips, neck, chest, axillae, abdomen, back, buttocks, bilateral upper extremities, bilateral lower extremities, hands, feet, fingers, toes, fingernails, and toenails. All findings within normal limits unless otherwise noted below.   Relevant exam findings are noted in the Assessment and Plan.    Assessment & Plan   LENTIGINES, SEBORRHEIC KERATOSES, HEMANGIOMAS - Benign normal skin lesions - Benign-appearing - Call for any changes  MELANOCYTIC NEVI - Tan-brown and/or pink-flesh-colored symmetric macules and papules - Benign appearing on exam today - Observation - Call clinic for new or changing moles - Recommend daily use of broad spectrum spf 30+ sunscreen to sun-exposed areas.   ACTINIC DAMAGE - Chronic condition, secondary to cumulative UV/sun exposure - diffuse scaly erythematous macules with underlying dyspigmentation - Recommend daily broad spectrum sunscreen SPF 30+  to sun-exposed areas, reapply every 2 hours as needed.  - Staying in the shade or wearing long sleeves, sun glasses (UVA+UVB protection) and wide brim hats (4-inch brim around the entire circumference of the hat) are also recommended for sun protection.  - Call for new or changing lesions.  ACNE VULGARIS Exam: Open comedones and inflammatory papules on the face  Flared  Treatment Plan: Discussed topical BPO and salicylic acid wash Patient would like to wait on any Rxs for now   SKIN CANCER SCREENING PERFORMED TODAY Return in about 2 years (around 11/17/2025) for 1-2 year for tbsc.   Documentation: I have reviewed the above documentation for accuracy and completeness, and I agree with the above.  Deneise Finlay, MD

## 2024-05-21 ENCOUNTER — Ambulatory Visit (HOSPITAL_COMMUNITY): Admission: EM | Admit: 2024-05-21 | Discharge: 2024-05-21 | Disposition: A | Discharge status: Home / Self Care

## 2024-05-21 ENCOUNTER — Encounter (HOSPITAL_COMMUNITY): Payer: Self-pay

## 2024-05-21 DIAGNOSIS — F411 Generalized anxiety disorder: Secondary | ICD-10-CM

## 2024-05-21 DIAGNOSIS — F5104 Psychophysiologic insomnia: Secondary | ICD-10-CM

## 2024-05-21 DIAGNOSIS — F419 Anxiety disorder, unspecified: Secondary | ICD-10-CM

## 2024-05-21 MED ORDER — HYDROXYZINE HCL 25 MG PO TABS: 12.5000 mg | ORAL_TABLET | Freq: Three times a day (TID) | ORAL | 3 refills | Status: DC | PRN

## 2024-05-21 MED ORDER — PROPRANOLOL HCL 10 MG PO TABS: 10.0000 mg | ORAL_TABLET | Freq: Every day | ORAL | 1 refills | Status: DC

## 2024-05-21 NOTE — Discharge Instructions (Addendum)
  1. Anxiety (Primary) - hydrOXYzine  (ATARAX ) 25 MG tablet; Take 0.5-1 tablets (12.5-25 mg total) by mouth every 8 (eight) hours as needed for anxiety.  Dispense: 30 tablet; Refill: 3 - propranolol (INDERAL) 10 MG tablet; Take 1 tablet (10 mg total) by mouth daily, maximum dose of 30 mg per 24 hours.  Dispense: 60 tablet; Refill: 1 - Resources provided for [facility] behavioral health urgent care for follow-up as needed for any uncontrolled anxiety and/or depression symptoms.  They can also establish you with psychiatry for further management of symptoms. - Follow-up with primary care provider as scheduled today in urgent care for further medication management and ongoing management of chronic medical concerns. -Continue to monitor symptoms for any change in severity if there is any escalation of current symptoms or development of new symptoms follow-up in ER for further evaluation and management.

## 2024-05-21 NOTE — ED Provider Notes (Signed)
 [facility] [place]  Note:  This document was prepared using Conservation officer, historic buildings and may include unintentional dictation errors.  MRN: [id number] DOB: Aug 01, 1995  Subjective:   Meghan Norman is a 28 y.o. female presenting for evaluation of persistent anxiety and palpitations symptoms that have increased over the last few weeks.  Patient reports that she has persistent depression and anxiety which is usually worse in the wintertime.  Patient reports increased stress and increased lack of sleep that may be contributing to anxiety symptoms.  Patient does not have a primary care provider at this time and is unable to get established with a new provider.  Patient states that she was previously placed on propranolol which seemed to help her symptoms significantly however she ran out of medication and had insurance difficulties and was unable to get prescribed medication since 2024.  Patient also reports that she has taken hydroxyzine  in the past for breakthrough anxiety.  Patient no longer is being covered by a psychiatrist for anxiety and depression symptoms.  Patient denies any shortness of breath, chest pain, weakness, dizziness, fatigue.  No current facility-administered medications for this encounter.  Current Outpatient Medications:    propranolol (INDERAL) 10 MG tablet, Take 1 tablet (10 mg total) by mouth daily., Disp: 60 tablet, Rfl: 1   testosterone cypionate (DEPOTESTOSTERONE CYPIONATE) 200 MG/ML injection, , Disp: , Rfl:    hydrOXYzine  (ATARAX ) 25 MG tablet, Take 0.5-1 tablets (12.5-25 mg total) by mouth every 8 (eight) hours as needed for anxiety., Disp: 30 tablet, Rfl: 3   omeprazole  (PRILOSEC) 20 MG capsule, Take 1 capsule (20 mg total) by mouth daily., Disp: 30 capsule, Rfl: 3   ondansetron  (ZOFRAN -ODT) 4 MG disintegrating tablet, Take 1 tablet (4 mg total) by mouth every 8 (eight) hours as needed for nausea or vomiting., Disp: 20 tablet, Rfl: 0   triamcinolone   cream (KENALOG ) 0.1 %, Apply 1 Application topically 2 (two) times daily., Disp: 30 g, Rfl: 0   Allergies  Allergen Reactions   Hydrocodone Itching   Singulair [Montelukast Sodium] Other (See Comments)    Bad mood swings    Past Medical History:  Diagnosis Date   Allergy    Anxiety    Depression      Past Surgical History:  Procedure Laterality Date   WISDOM TOOTH EXTRACTION Bilateral     Family History  Problem Relation Age of Onset   Hyperlipidemia Mother    Hyperlipidemia Father    Diabetes Maternal Grandfather    Melanoma Other     Social History   Tobacco Use   Smoking status: Never   Smokeless tobacco: Never  Vaping Use   Vaping status: Never Used  Substance Use Topics   Alcohol use: Not Currently   Drug use: No    ROS Refer to HPI for ROS details.  Objective:    Vitals: BP 115/81 (BP Location: Left Arm)   Pulse (!) 108   Temp 98.3 F (36.8 C) (Oral)   Resp 20   Ht 5' 1 (1.549 m)   Wt 147 lb (66.7 kg)   SpO2 97%   BMI 27.78 kg/m   Physical Exam Vitals and nursing note reviewed.  Constitutional:      General: She is not in acute distress.    Appearance: Normal appearance. She is well-developed. She is not ill-appearing or toxic-appearing.  HENT:     Head: Normocephalic and atraumatic.  Eyes:     Extraocular Movements: Extraocular movements intact.  Conjunctiva/sclera: Conjunctivae normal.     Pupils: Pupils are equal, round, and reactive to light.  Cardiovascular:     Rate and Rhythm: Normal rate.  Pulmonary:     Effort: Pulmonary effort is normal. No respiratory distress.     Breath sounds: No stridor. No wheezing.  Skin:    General: Skin is warm and dry.  Neurological:     General: No focal deficit present.     Mental Status: She is alert and oriented to person, place, and time.  Psychiatric:        Attention and Perception: Attention and perception normal.        Mood and Affect: Mood and affect normal.        Behavior:  Behavior normal.        Thought Content: Thought content normal.        Cognition and Memory: Cognition normal.        Judgment: Judgment normal.     Procedures  No results found for this or any previous visit (from the past 24 hours).  Assessment and Plan :     Discharge Instructions       1. Anxiety (Primary) - hydrOXYzine  (ATARAX ) 25 MG tablet; Take 0.5-1 tablets (12.5-25 mg total) by mouth every 8 (eight) hours as needed for anxiety.  Dispense: 30 tablet; Refill: 3 - propranolol (INDERAL) 10 MG tablet; Take 1 tablet (10 mg total) by mouth daily, maximum dose of 30 mg per 24 hours.  Dispense: 60 tablet; Refill: 1 - Resources provided for [facility] behavioral health urgent care for follow-up as needed for any uncontrolled anxiety and/or depression symptoms.  They can also establish you with psychiatry for further management of symptoms. - Follow-up with primary care provider as scheduled today in urgent care for further medication management and ongoing management of chronic medical concerns. -Continue to monitor symptoms for any change in severity if there is any escalation of current symptoms or development of new symptoms follow-up in ER for further evaluation and management.      Cleland Simkins B Arah Aro   Tamme Mozingo, [place], [place] 05/21/24 [phone]

## 2024-05-21 NOTE — ED Triage Notes (Signed)
 Pt states that she has a history of anxiety. Pt states that she is experiencing some stress and lack of sleep. Pt states that she doesn't have a PCP at the time. Pt states that she was on a medication that was once helpful. Pt states that she would like a refill on medication until she can get in to see a pcp.

## 2024-05-25 ENCOUNTER — Encounter (HOSPITAL_COMMUNITY): Payer: Self-pay

## 2024-05-25 ENCOUNTER — Ambulatory Visit (INDEPENDENT_AMBULATORY_CARE_PROVIDER_SITE_OTHER)

## 2024-05-25 ENCOUNTER — Ambulatory Visit (HOSPITAL_COMMUNITY): Admission: EM | Admit: 2024-05-25 | Discharge: 2024-05-25 | Disposition: A | Discharge status: Home / Self Care

## 2024-05-25 DIAGNOSIS — R0602 Shortness of breath: Secondary | ICD-10-CM

## 2024-05-25 MED ORDER — PSEUDOEPHEDRINE HCL 30 MG PO TABS: 30.0000 mg | ORAL_TABLET | ORAL | 0 refills | Status: DC | PRN

## 2024-05-25 MED ORDER — BENZONATATE 100 MG PO CAPS: 100.0000 mg | ORAL_CAPSULE | Freq: Three times a day (TID) | ORAL | 0 refills | Status: DC

## 2024-05-25 NOTE — ED Provider Notes (Signed)
 PCP: Patient, No Pcp Per Chief Complaint: Shortness of Breath, Cough, and Nasal Congestion    Subjective:   HPI: Patient is a 28 y.o. female here for worsening viral respiratory symptoms as well as shortness of breath with mucus and congestion that is worsening.  Patient was evaluated on 05/20/2024 for upper respiratory issues and tested negative for flu, strep, COVID.  Since that time she did have a moment of slight improvement, but has since worsened with increased cough, congestion, chills.  She is trying over-the-counter cough medicine, without much relief.  She does have a history of anxiety as well as somewhat increased heart rate.  She states that her roommates are ill and one of them was diagnosed with pneumonia and with her worsening chest congestion and pressure she is wondering if she needs a chest x-ray.  Patient did mention she has had a long history of difficulty breathing with a younger brother who was diagnosed with asthma.  She was never tested for asthma as a child.  Past Medical History:  Diagnosis Date   Allergy    Anxiety    Depression     No current facility-administered medications on file prior to encounter.   Current Outpatient Medications on File Prior to Encounter  Medication Sig Dispense Refill   hydrOXYzine  (ATARAX ) 25 MG tablet Take 0.5-1 tablets (12.5-25 mg total) by mouth every 8 (eight) hours as needed for anxiety. 30 tablet 3   omeprazole  (PRILOSEC) 20 MG capsule Take 1 capsule (20 mg total) by mouth daily. (Patient not taking: Reported on 05/25/2024) 30 capsule 3   ondansetron  (ZOFRAN -ODT) 4 MG disintegrating tablet Take 1 tablet (4 mg total) by mouth every 8 (eight) hours as needed for nausea or vomiting. 20 tablet 0   propranolol (INDERAL) 10 MG tablet Take 1 tablet (10 mg total) by mouth daily. 60 tablet 1   testosterone cypionate (DEPOTESTOSTERONE CYPIONATE) 200 MG/ML injection      triamcinolone  cream (KENALOG ) 0.1 % Apply 1 Application topically 2  (two) times daily. (Patient not taking: Reported on 05/25/2024) 30 g 0    BP 116/77 (BP Location: Right Arm)   Pulse 97   Temp 98.4 F (36.9 C) (Oral)   Resp 18   SpO2 96%        Objective:   Gen: Well developed, well nourished female in no acute distress. HEENT: Pupils equal, round, and reactive to light.  Conjunctiva non-injected.  Nares patent without discharge.  Oral mucosa is moist and pink.  Posterior pharynx clear without erythema.  No cervical lymphadenopathy palpated, bilateral tympanic membranes are pearly gray, she is congested CV: Regular rate and rhythm without murmurs, gallops, or rubs. Lungs: Clear to auscultation bilaterally with good effort, no wheezing, rhonchi or crackles Ext: No cyanosis, clubbing, or edema.  Assessment/Plan:   Aniyah Nobis is a 28 y.o. female who was seen today for the following: 1. SOB (shortness of breath) (Primary) - DG Chest 2 View; Standing - DG Chest 2 View - As I discussed the plan to get a chest x-ray, patient requested that she be discharged after the x-ray is complete and have a phone call with the results - She stated that she gets increased anxiety whenever she is sitting and waiting for things to occur - I discussed with her that this would be appropriate that we can call her with results once the radiologist has not read - In terms of conservative management I recommended continuing fluids and rest - We discussed potential treatment  options of her congestion and cough which included Sudafed and Tessalon Perles - These will be sent in for her to help with her symptoms - Will call her with x-ray results and if needed, start antibiotic treatment  Follow-up/Education:   May return sooner as needed and encouraged to call/e-mail for additional questions or  worsening symptoms in the interim.  Krystal Lowing, DO Sports Medicine Fellow 05/25/2024 11:49 AM  Disclaimer: This transcription was electronically signed. It was  transcribed by [place] and may contain errors in the text that were not recognized on proofreading.     Lowing Krystal HERO, DO 05/25/24 1150

## 2024-05-25 NOTE — ED Triage Notes (Signed)
 Patient reports that she went to the [facility] on 05/20/24 and tested negative for Flu, strep, and Covid. Patient states she has had increased SOB, a non productive cough and nasal congestion that has worsened in the past 2 days. Patient states she has a room mate that has walking pneumonia and states she wants a chest x-ray.  Patient states she has been taking Tylenol  and generic Nyquil

## 2024-07-04 ENCOUNTER — Ambulatory Visit: Admitting: Family

## 2024-08-01 ENCOUNTER — Encounter: Payer: Self-pay | Admitting: Family

## 2024-08-01 ENCOUNTER — Ambulatory Visit: Admitting: Family

## 2024-08-01 VITALS — BP 119/78 | HR 87 | Ht 62.0 in | Wt 157.0 lb

## 2024-08-01 DIAGNOSIS — F988 Other specified behavioral and emotional disorders with onset usually occurring in childhood and adolescence: Secondary | ICD-10-CM | POA: Diagnosis not present

## 2024-08-01 DIAGNOSIS — Z0001 Encounter for general adult medical examination with abnormal findings: Secondary | ICD-10-CM

## 2024-08-01 DIAGNOSIS — Z0000 Encounter for general adult medical examination without abnormal findings: Secondary | ICD-10-CM

## 2024-08-01 DIAGNOSIS — Z131 Encounter for screening for diabetes mellitus: Secondary | ICD-10-CM

## 2024-08-01 DIAGNOSIS — F5104 Psychophysiologic insomnia: Secondary | ICD-10-CM

## 2024-08-01 DIAGNOSIS — F429 Obsessive-compulsive disorder, unspecified: Secondary | ICD-10-CM | POA: Diagnosis not present

## 2024-08-01 DIAGNOSIS — Z1329 Encounter for screening for other suspected endocrine disorder: Secondary | ICD-10-CM

## 2024-08-01 DIAGNOSIS — Z1159 Encounter for screening for other viral diseases: Secondary | ICD-10-CM

## 2024-08-01 DIAGNOSIS — R0602 Shortness of breath: Secondary | ICD-10-CM

## 2024-08-01 DIAGNOSIS — L989 Disorder of the skin and subcutaneous tissue, unspecified: Secondary | ICD-10-CM | POA: Diagnosis not present

## 2024-08-01 DIAGNOSIS — F411 Generalized anxiety disorder: Secondary | ICD-10-CM

## 2024-08-01 DIAGNOSIS — Z130 Encounter for screening for diseases of the blood and blood-forming organs and certain disorders involving the immune mechanism: Secondary | ICD-10-CM

## 2024-08-01 DIAGNOSIS — Z13228 Encounter for screening for other metabolic disorders: Secondary | ICD-10-CM

## 2024-08-01 DIAGNOSIS — Z13220 Encounter for screening for lipoid disorders: Secondary | ICD-10-CM

## 2024-08-01 DIAGNOSIS — F419 Anxiety disorder, unspecified: Secondary | ICD-10-CM

## 2024-08-01 DIAGNOSIS — Z1321 Encounter for screening for nutritional disorder: Secondary | ICD-10-CM

## 2024-08-01 DIAGNOSIS — Z5320 Procedure and treatment not carried out because of patient's decision for unspecified reasons: Secondary | ICD-10-CM

## 2024-08-01 DIAGNOSIS — Z7689 Persons encountering health services in other specified circumstances: Secondary | ICD-10-CM

## 2024-08-01 MED ORDER — HYDROXYZINE HCL 25 MG PO TABS: 12.5000 mg | ORAL_TABLET | Freq: Three times a day (TID) | ORAL | 1 refills | Status: DC | PRN

## 2024-08-01 MED ORDER — PROPRANOLOL HCL 10 MG PO TABS: 10.0000 mg | ORAL_TABLET | Freq: Every day | ORAL | 0 refills | Status: AC

## 2024-08-01 NOTE — Progress Notes (Signed)
 "  Subjective:    Meghan Norman - 29 y.o. female MRN [id number]  Date of birth: [birth year]  HPI  Meghan Norman is to establish care and annual physical exam.  Current issues and/or concerns: - Due for cervical cancer screening. Patient declines and states currently established with [facility] for testosterone injections.  - Patient seen on 05/21/2024 (56 minutes) at [facility] at [facility] for anxiety and additional diagnoses. Today patient reports feeling improved and denies red flag symptoms. Doing well on Hydroxyzine  and Propranolol , no issues/concerns. States established with a therapist. Patient denies thoughts of self-harm, suicidal ideations, homicidal ideations. - Patient seen on 05/25/2024 (54 minutes) at [facility] at [facility] for shortness of breath. Today patient reports feeling improved, symptom resolved, and denies red flag symptoms.  - Vitamin D  lab. - States in the past seen by Dermatology who states the two spots on bilateral lower extremities were related to hair follicles. Patient would like second opinion referral.      08/01/2024    2:14 PM 09/20/2023   10:23 AM 08/19/2017    8:53 AM 05/19/2017   11:00 AM 12/05/2016   10:27 AM  Depression screen PHQ 2/9  Decreased Interest 0 0 0 0 0  Down, Depressed, Hopeless 0 0 0 0 0  PHQ - 2 Score 0 0 0 0 0  Altered sleeping 1 3     Tired, decreased energy 0 2     Change in appetite 0 3     Feeling bad or failure about yourself  0 2     Trouble concentrating 0 0     Moving slowly or fidgety/restless 0 0     Suicidal thoughts 0 0     PHQ-9 Score 1 10      Difficult doing work/chores Not difficult at all Very difficult        Data saved with a previous flowsheet row definition    ROS per HPI    Health Maintenance:  Health Maintenance Due  Topic Date Due   Hepatitis C Screening  Never done   DTaP/Tdap/Td (1 - Tdap) Never done   Hepatitis B Vaccines 19-59 Average Risk (1 of 3 - 19+  3-dose series) Never done   Cervical Cancer Screening (Pap smear)  05/19/2020   Influenza Vaccine  02/11/2024   COVID-19 Vaccine (4 - 2025-26 season) 03/13/2024     Past Medical History: There are no active problems to display for this patient.     Social History   reports that she has never smoked. She has never used smokeless tobacco. She reports current alcohol use. She reports current drug use. Drug: Marijuana.   Family History  family history includes Diabetes in her maternal grandfather; Hyperlipidemia in her father and mother; Melanoma in an other family member.   Medications: reviewed and updated   Objective:   Physical Exam BP 119/78   Pulse 87   Ht 5' 2 (1.575 m)   Wt 157 lb (71.2 kg)   SpO2 97%   BMI 28.72 kg/m   Physical Exam HENT:     Head: Normocephalic and atraumatic.     Right Ear: Tympanic membrane, ear canal and external ear normal.     Left Ear: Tympanic membrane, ear canal and external ear normal.     Nose: Nose normal.     Mouth/Throat:     Mouth: Mucous membranes are moist.     Pharynx: Oropharynx is clear.  Eyes:  Extraocular Movements: Extraocular movements intact.     Conjunctiva/sclera: Conjunctivae normal.     Pupils: Pupils are equal, round, and reactive to light.  Neck:     Thyroid : No thyroid  mass, thyromegaly or thyroid  tenderness.  Cardiovascular:     Rate and Rhythm: Normal rate and regular rhythm.     Pulses: Normal pulses.     Heart sounds: Normal heart sounds.  Pulmonary:     Effort: Pulmonary effort is normal.     Breath sounds: Normal breath sounds.  Chest:     Comments: Patient declined. Patient states has on a binder. Abdominal:     General: Bowel sounds are normal.     Palpations: Abdomen is soft.  Genitourinary:    Comments: Patient declined.  Musculoskeletal:        General: Normal range of motion.     Right shoulder: Normal.     Left shoulder: Normal.     Right upper arm: Normal.     Left upper arm:  Normal.     Right elbow: Normal.     Left elbow: Normal.     Right forearm: Normal.     Left forearm: Normal.     Right wrist: Normal.     Left wrist: Normal.     Right hand: Normal.     Left hand: Normal.     Cervical back: Normal, normal range of motion and neck supple.     Thoracic back: Normal.     Lumbar back: Normal.     Right hip: Normal.     Left hip: Normal.     Right upper leg: Normal.     Left upper leg: Normal.     Right knee: Normal.     Left knee: Normal.     Right lower leg: Normal.     Left lower leg: Normal.     Right ankle: Normal.     Left ankle: Normal.     Right foot: Normal.     Left foot: Normal.  Skin:    General: Skin is warm and dry.     Capillary Refill: Capillary refill takes less than 2 seconds.     Comments: One hyperpigmented spot on right lower extremity and left lower extremity, no drainage.  Neurological:     General: No focal deficit present.     Mental Status: She is alert and oriented to person, place, and time.  Psychiatric:        Mood and Affect: Mood normal.        Behavior: Behavior normal.     Assessment & Plan:  1. Encounter to establish care (Primary) 2. Annual physical exam - Counseled on 150 minutes of exercise per week as tolerated, healthy eating (including decreased daily intake of saturated fats, cholesterol, added sugars, sodium), STI prevention, and routine healthcare maintenance.  3. Screening for metabolic disorder - Routine screening.  - CMP14+EGFR  4. Screening for deficiency anemia - Routine screening.  - CBC  5. Diabetes mellitus screening - Routine screening.  - Hemoglobin A1c  6. Screening cholesterol level - Routine screening.  - Lipid panel  7. Thyroid  disorder screen - Routine screening.  - TSH  8. Need for hepatitis C screening test - Routine screening.  - Hepatitis C antibody  9. Cervical cancer screening declined - Patient declined.   10. Anxiety 11. Anxiety state 12.  Psychophysiological insomnia 13. Obsessive-compulsive disorder, unspecified type 14. Attention deficit disorder, unspecified type - Patient denies thoughts of self-harm,  suicidal ideations, homicidal ideations. - Continue Hydroxyzine  and Propranolol  as prescribed. Counseled on medication adherence/adverse effects.  - Keep all scheduled appointments with established therapist.  - Patient declined referral to Psychiatry.  - Follow-up with primary provider in 3 months or sooner if needed.  - hydrOXYzine  (ATARAX ) 25 MG tablet; Take 0.5-1 tablets (12.5-25 mg total) by mouth every 8 (eight) hours as needed for anxiety.  Dispense: 90 tablet; Refill: 1 - propranolol  (INDERAL ) 10 MG tablet; Take 1 tablet (10 mg total) by mouth daily.  Dispense: 90 tablet; Refill: 0  15. Shortness of breath - Patient today in office with no cardiopulmonary/acute distress.  - Resolved.   16. Skin abnormalities - Referral to Dermatology for evaluation/management.  - Ambulatory referral to Dermatology  17. Encounter for vitamin deficiency screening - Routine screening.  - Vitamin D , 25-hydroxy    Patient was given clear instructions to go to Emergency Department or return to medical center if symptoms don't improve, worsen, or new problems develop.The patient verbalized understanding.  I discussed the assessment and treatment plan with the patient. The patient was provided an opportunity to ask questions and all were answered. The patient agreed with the plan and demonstrated an understanding of the instructions.   The patient was advised to call back or seek an in-person evaluation if the symptoms worsen or if the condition fails to improve as anticipated.    Greig Chute, NP 08/01/2024, 2:45 PM Primary Care at [facility]  "

## 2024-08-02 ENCOUNTER — Ambulatory Visit: Payer: Self-pay | Admitting: Family

## 2024-08-02 ENCOUNTER — Other Ambulatory Visit: Payer: Self-pay | Admitting: Family

## 2024-08-02 DIAGNOSIS — Z13228 Encounter for screening for other metabolic disorders: Secondary | ICD-10-CM

## 2024-08-02 DIAGNOSIS — F988 Other specified behavioral and emotional disorders with onset usually occurring in childhood and adolescence: Secondary | ICD-10-CM

## 2024-08-02 DIAGNOSIS — F411 Generalized anxiety disorder: Secondary | ICD-10-CM

## 2024-08-02 DIAGNOSIS — Z13220 Encounter for screening for lipoid disorders: Secondary | ICD-10-CM

## 2024-08-02 DIAGNOSIS — F419 Anxiety disorder, unspecified: Secondary | ICD-10-CM

## 2024-08-02 DIAGNOSIS — Z130 Encounter for screening for diseases of the blood and blood-forming organs and certain disorders involving the immune mechanism: Secondary | ICD-10-CM

## 2024-08-02 DIAGNOSIS — F5104 Psychophysiologic insomnia: Secondary | ICD-10-CM

## 2024-08-02 DIAGNOSIS — F429 Obsessive-compulsive disorder, unspecified: Secondary | ICD-10-CM

## 2024-08-02 LAB — CBC
Hematocrit: 50.3 % — ABNORMAL HIGH (ref 34.0–46.6)
Hemoglobin: 16.8 g/dL — ABNORMAL HIGH (ref 11.1–15.9)
MCH: 30.4 pg (ref 26.6–33.0)
MCHC: 33.4 g/dL (ref 31.5–35.7)
MCV: 91 fL (ref 79–97)
Platelets: 231 x10E3/uL (ref 150–450)
RBC: 5.53 x10E6/uL — ABNORMAL HIGH (ref 3.77–5.28)
RDW: 12.5 % (ref 11.7–15.4)
WBC: 5.7 x10E3/uL (ref 3.4–10.8)

## 2024-08-02 LAB — HEMOGLOBIN A1C
Est. average glucose Bld gHb Est-mCnc: 100 mg/dL
Hgb A1c MFr Bld: 5.1 % (ref 4.8–5.6)

## 2024-08-02 LAB — VITAMIN D 25 HYDROXY (VIT D DEFICIENCY, FRACTURES): Vit D, 25-Hydroxy: 44.7 ng/mL (ref 30.0–100.0)

## 2024-08-02 LAB — LIPID PANEL
Chol/HDL Ratio: 3.7 ratio (ref 0.0–4.4)
Cholesterol, Total: 188 mg/dL (ref 100–199)
HDL: 51 mg/dL
LDL Chol Calc (NIH): 117 mg/dL — ABNORMAL HIGH (ref 0–99)
Triglycerides: 113 mg/dL (ref 0–149)
VLDL Cholesterol Cal: 20 mg/dL (ref 5–40)

## 2024-08-02 LAB — HEPATITIS C ANTIBODY: Hep C Virus Ab: NONREACTIVE

## 2024-08-02 LAB — CMP14+EGFR
ALT: 86 IU/L — ABNORMAL HIGH (ref 0–32)
AST: 37 IU/L (ref 0–40)
Albumin: 4.6 g/dL (ref 4.0–5.0)
Alkaline Phosphatase: 76 IU/L (ref 41–116)
BUN/Creatinine Ratio: 15 (ref 9–23)
BUN: 11 mg/dL (ref 6–20)
Bilirubin Total: 0.6 mg/dL (ref 0.0–1.2)
CO2: 25 mmol/L (ref 20–29)
Calcium: 9.7 mg/dL (ref 8.7–10.2)
Chloride: 100 mmol/L (ref 96–106)
Creatinine, Ser: 0.74 mg/dL (ref 0.57–1.00)
Globulin, Total: 2.3 g/dL (ref 1.5–4.5)
Glucose: 83 mg/dL (ref 70–99)
Potassium: 4.2 mmol/L (ref 3.5–5.2)
Sodium: 141 mmol/L (ref 134–144)
Total Protein: 6.9 g/dL (ref 6.0–8.5)
eGFR: 112 mL/min/1.73

## 2024-08-02 LAB — TSH: TSH: 1.09 u[IU]/mL (ref 0.450–4.500)

## 2024-08-02 MED ORDER — HYDROXYZINE HCL 25 MG PO TABS: 12.5000 mg | ORAL_TABLET | Freq: Three times a day (TID) | ORAL | 0 refills | Status: AC | PRN

## 2024-08-02 NOTE — Telephone Encounter (Signed)
-   Not fasting will likely affect cholesterol lab.  - Hydroxyzine  prescribed (08/02/2024  2:05 PM EST) 180 tablets.  - Propranolol  prescribed (08/01/2024  2:45 PM EST) 90 tablets.

## 2024-08-05 ENCOUNTER — Ambulatory Visit (HOSPITAL_COMMUNITY)
Admission: EM | Admit: 2024-08-05 | Discharge: 2024-08-05 | Disposition: A | Discharge status: Home / Self Care | Attending: Emergency Medicine | Admitting: Emergency Medicine

## 2024-08-05 ENCOUNTER — Encounter (HOSPITAL_COMMUNITY): Payer: Self-pay

## 2024-08-05 DIAGNOSIS — T8029XA Infection following other infusion, transfusion and therapeutic injection, initial encounter: Secondary | ICD-10-CM | POA: Diagnosis not present

## 2024-08-05 MED ORDER — MUPIROCIN 2 % EX OINT: 1.0000 | TOPICAL_OINTMENT | Freq: Two times a day (BID) | CUTANEOUS | 0 refills | Status: AC

## 2024-08-05 MED ORDER — CEPHALEXIN 500 MG PO CAPS: 500.0000 mg | ORAL_CAPSULE | Freq: Two times a day (BID) | ORAL | 0 refills | Status: AC

## 2024-08-05 NOTE — ED Provider Notes (Signed)
 " [facility]    CSN: [id number] Arrival date & time: 08/05/24  1741      History   Chief Complaint Chief Complaint  Patient presents with   Wound Check    HPI Meghan Norman is a 29 y.o. female.   Patient presents for concerns of possible infection from injection site.  Patient reports that they inject themselves with testosterone weekly to the abdomen.  Patient reports that the initially injected in 1 place but then remove this and moved to another place without replacing the needle and now believes that the area is infected.  Patient does report a history of a staph infection that they were hospitalized for in the past and was concerned about developing this again.  Denies any fever.  The history is provided by the patient and medical records.  Wound Check    Past Medical History:  Diagnosis Date   Allergy    Anxiety    Depression     There are no active problems to display for this patient.   Past Surgical History:  Procedure Laterality Date   WISDOM TOOTH EXTRACTION Bilateral     OB History   No obstetric history on file.      Home Medications    Prior to Admission medications  Medication Sig Start Date End Date Taking? Authorizing Provider  cephALEXin  (KEFLEX ) 500 MG capsule Take 1 capsule (500 mg total) by mouth 2 (two) times daily for 7 days. 08/05/24 08/12/24 Yes Johnie, Khalen Styer A, NP  hydrOXYzine  (ATARAX ) 25 MG tablet Take 0.5-1 tablets (12.5-25 mg total) by mouth every 8 (eight) hours as needed for anxiety. 08/02/24  Yes Massey, Amy J, NP  mupirocin  ointment (BACTROBAN ) 2 % Apply 1 Application topically 2 (two) times daily. 08/05/24  Yes Johnie Flaming A, NP  ondansetron  (ZOFRAN -ODT) 4 MG disintegrating tablet Take 1 tablet (4 mg total) by mouth every 8 (eight) hours as needed for nausea or vomiting. 09/20/23  Yes Mayers, Cari S, PA-C  propranolol  (INDERAL ) 10 MG tablet Take 1 tablet (10 mg total) by mouth daily. 08/01/24  Yes Jaycee Greig PARAS, NP   testosterone cypionate (DEPOTESTOSTERONE CYPIONATE) 200 MG/ML injection  05/19/24  Yes [provider]    Family History Family History  Problem Relation Age of Onset   Hyperlipidemia Mother    Hyperlipidemia Father    Diabetes Maternal Grandfather    Melanoma Other     Social History Social History[1]   Allergies   Hydrocodone, Singulair [montelukast sodium], and Vancomycin   Review of Systems Review of Systems  Per HPI  Physical Exam Triage Vital Signs ED Triage Vitals [08/05/24 1802]  Encounter Vitals Group     BP 123/71     Girls Systolic BP Percentile      Girls Diastolic BP Percentile      Boys Systolic BP Percentile      Boys Diastolic BP Percentile      Pulse Rate 97     Resp 18     Temp 97.8 F (36.6 C)     Temp Source Oral     SpO2 98 %     Weight      Height      Head Circumference      Peak Flow      Pain Score      Pain Loc      Pain Education      Exclude from Growth Chart    No data found.  Updated Vital  Signs BP 123/71 (BP Location: Left Arm)   Pulse 97   Temp 97.8 F (36.6 C) (Oral)   Resp 18   SpO2 98%   Visual Acuity Right Eye Distance:   Left Eye Distance:   Bilateral Distance:    Right Eye Near:   Left Eye Near:    Bilateral Near:     Physical Exam Vitals and nursing note reviewed.  Constitutional:      General: She is awake. She is not in acute distress.    Appearance: Normal appearance. She is well-developed and well-groomed. She is not ill-appearing.  Abdominal:   Skin:    Findings: Erythema present.     Comments: Area of erythema and warmth noted to left lower abdomen around injection site  Neurological:     General: No focal deficit present.     Mental Status: She is alert and oriented to person, place, and time.  Psychiatric:        Behavior: Behavior is cooperative.      UC Treatments / Results  Labs (all labs ordered are listed, but only abnormal results are displayed) Labs Reviewed - No  data to display  EKG   Radiology No results found.  Procedures Procedures (including critical care time)  Medications Ordered in UC Medications - No data to display  Initial Impression / Assessment and Plan / UC Course  I have reviewed the triage vital signs and the nursing notes.  Pertinent labs & imaging results that were available during my care of the patient were reviewed by me and considered in my medical decision making (see chart for details).     Patient is overall well-appearing.  Vitals are stable.  Prescribed cephalexin  and mupirocin  for infection coverage.  Discussed proper wound care and steps to avoid this in the future.  Discussed follow-up and strict return precautions. Final Clinical Impressions(s) / UC Diagnoses   Final diagnoses:  Infection of injection site, initial encounter     Discharge Instructions      Start taking cephalexin  twice daily for 7 days for infection coverage. You can also apply mupirocin  ointment twice daily to the area for additional infection coverage. Keep the area clean and dry. Avoid injecting every injecting with the same needle as this can cause infection. Return here if you notice increased spreading of redness or swelling or any signs of worsening infection.   ED Prescriptions     Medication Sig Dispense Auth. Provider   cephALEXin  (KEFLEX ) 500 MG capsule Take 1 capsule (500 mg total) by mouth 2 (two) times daily for 7 days. 14 capsule Johnie, Akisha Sturgill A, NP   mupirocin  ointment (BACTROBAN ) 2 % Apply 1 Application topically 2 (two) times daily. 22 g Johnie Flaming A, NP      PDMP not reviewed this encounter.    [1]  Social History Tobacco Use   Smoking status: Never   Smokeless tobacco: Never  Vaping Use   Vaping status: Every Day  Substance Use Topics   Alcohol use: Yes    Comment: rare   Drug use: Yes    Types: Marijuana    Comment: rare     Johnie Flaming LABOR, NP 08/05/24 1821  "

## 2024-08-05 NOTE — ED Triage Notes (Signed)
 Pt is here for a wound check on the left side of her abdomen. Patient states she injects herself with testosterone weekly. Report wound looks infected.

## 2024-08-05 NOTE — Discharge Instructions (Signed)
 Start taking cephalexin  twice daily for 7 days for infection coverage. You can also apply mupirocin  ointment twice daily to the area for additional infection coverage. Keep the area clean and dry. Avoid injecting every injecting with the same needle as this can cause infection. Return here if you notice increased spreading of redness or swelling or any signs of worsening infection.

## 2024-08-08 ENCOUNTER — Telehealth: Payer: Self-pay

## 2024-08-08 ENCOUNTER — Telehealth: Payer: Self-pay | Admitting: Family

## 2024-08-08 NOTE — Telephone Encounter (Signed)
 Patient established care on 08/01/2024. Labs resulted on 08/02/2024  8:27 AM EST. It is noted patient read lab results on 08/02/2024  9:23 AM. Schedule appointment for further discussion of labs if patient prefers.

## 2024-08-08 NOTE — Telephone Encounter (Signed)
 This RN contacted [employer] [phone]. They confirmed hydroxyzine  ordered and ready. States propanolol was ordered, but insurance won't cover until Feb 24th, [place] cost is $15.02.   Patient made aware. Per pt request, reviewed lab results per provider's notes. Reviewed dietary recommendations for improving LDL and potentially LFTs. Scheduled f/u lab per provider on Feb 17th. Pt advised to call with any other questions or concerns.   Copied from CRM #[id number]. Topic: Clinical - Medication Refill >> Aug 08, 2024  9:23 AM [name] wrote: Medication:  - Hydroxyzine   - Propranolol    90 day supply requested by the patient.  He has requested this medication by is super frustrated nothing has been returned back stating they're ready for pickup.    Has the patient contacted their pharmacy? Yes (Agent: If no, request that the patient contact the pharmacy for the refill. If patient does not wish to contact the pharmacy document the reason why and proceed with request.) (Agent: If yes, when and what did the pharmacy advise?)  This is the patient's preferred pharmacy:  [facility] [address] [place], [place] - [address] [place] [address] Phone: [phone] Fax: [phone]  Is this the correct pharmacy for this prescription? Yes If no, delete pharmacy and type the correct one.   Has the prescription been filled recently? Yes  Is the patient out of the medication? Yes  Has the patient been seen for an appointment in the last year OR does the patient have an upcoming appointment? Yes  Can we respond through MyChart? Yes  Agent: Please be advised that Rx refills may take up to 3 business days. We ask that you follow-up with your pharmacy.

## 2024-08-08 NOTE — Telephone Encounter (Signed)
 Copied from CRM [id number]. Topic: Clinical - Lab/Test Results >> Aug 08, 2024  9:46 AM Meghan Norman wrote: Reason for CRM: Patient called in highly upset and frustrated due to not being able to receive her Follow Up Test results.   Patient has been waiting an entire week, and no hear back from PCP. She feels very misheard and understood. Patient stated that she has reached out numerous times with no call back.  She is very worried and anxiety ridden.  She stated that this is a delay in patient care and its makes her feel as if she isn't being heard.  Please reach out ASAP!!

## 2024-08-29 ENCOUNTER — Other Ambulatory Visit: Payer: Self-pay
